# Patient Record
Sex: Female | Born: 1945 | Race: White | Hispanic: No | Smoking: Current every day smoker
Health system: Southern US, Community
[De-identification: ages and names within clinical notes are randomized; demographics above are authoritative.]

## PROBLEM LIST (undated history)

## (undated) DIAGNOSIS — N390 Urinary tract infection, site not specified: Secondary | ICD-10-CM

## (undated) DIAGNOSIS — T7840XA Allergy, unspecified, initial encounter: Secondary | ICD-10-CM

## (undated) DIAGNOSIS — E079 Disorder of thyroid, unspecified: Secondary | ICD-10-CM

## (undated) DIAGNOSIS — E785 Hyperlipidemia, unspecified: Secondary | ICD-10-CM

## (undated) HISTORY — DX: Urinary tract infection, site not specified: N39.0

## (undated) HISTORY — DX: Disorder of thyroid, unspecified: E07.9

## (undated) HISTORY — DX: Hyperlipidemia, unspecified: E78.5

## (undated) HISTORY — DX: Allergy, unspecified, initial encounter: T78.40XA

---

## 2011-07-18 ENCOUNTER — Ambulatory Visit (INDEPENDENT_AMBULATORY_CARE_PROVIDER_SITE_OTHER): Payer: Medicare Other | Admitting: Internal Medicine

## 2011-07-18 ENCOUNTER — Encounter: Payer: Self-pay | Admitting: Internal Medicine

## 2011-07-18 VITALS — BP 114/70 | HR 95 | Temp 97.6°F | Ht 66.0 in | Wt 151.0 lb

## 2011-07-18 DIAGNOSIS — S0003XA Contusion of scalp, initial encounter: Secondary | ICD-10-CM

## 2011-07-18 DIAGNOSIS — E785 Hyperlipidemia, unspecified: Secondary | ICD-10-CM | POA: Insufficient documentation

## 2011-07-18 DIAGNOSIS — E039 Hypothyroidism, unspecified: Secondary | ICD-10-CM | POA: Insufficient documentation

## 2011-07-18 DIAGNOSIS — Z0000 Encounter for general adult medical examination without abnormal findings: Secondary | ICD-10-CM

## 2011-07-18 DIAGNOSIS — S0083XA Contusion of other part of head, initial encounter: Secondary | ICD-10-CM

## 2011-07-18 MED ORDER — LEVOTHYROXINE SODIUM 75 MCG PO TABS: 75.0000 ug | ORAL_TABLET | Freq: Every day | ORAL | Status: DC

## 2011-07-18 NOTE — Progress Notes (Signed)
Subjective:    Patient ID: Anne Wiggins, female    DOB: 10-17-1945, 66 y.o.   MRN: [id number]  HPI  66 year old patient who is seen today to establish with our practice. She has recently relocated from [place]. 2 weeks ago she slipped on the ice sustaining a occipital scalp hematoma. This has slowly gotten smaller in size over the past 2 weeks. She has had no postconcussive symptoms. She has had only mild local scalp tenderness that is improving. There was no loss of consciousness.  Past medical history is fairly unremarkable. She has a history of hypothyroidism and has been on supplemental levothyroxine for some time. She has a history of dyslipidemia but never has been treated. Past surgical history is remarkable for a remote tonsillectomy. She also had  tendon repair surgery approximately one year ago. She has declined colonoscopies in the past   family history fairly noncontributory details of her father's health unknown. Mother died of metastatic gastric cancer   social history. Recently a relocated from [place] to be closer to their children. Presently using tobacco but attempting to taper  1. Risk factors, based on past  M,S,F history-  cardiovascular risk factors include a history of dyslipidemia and ongoing tobacco use  2.  Physical activities: Remains quite active no exercise limitations  3.  Depression/mood: No history depression or mood disorder  4.  Hearing: No deficits  5.  ADL's: Independent in all aspects of daily living. Lives alone  6.  Fall risk: Low  7.  Home safety: No problems identified  8.  Height weight, and visual acuity; height and weight stable no change in visual acuity  9.  Counseling: Colonoscopy encouraged vitamin D and calcium supplementation encouraged  10. Lab orders based on risk factors: Will check her Habits the time of a physical in 6 months  11. Referral : Not appropriate at this time 12. Care plan: Smoking cessation encouraged  13.  Cognitive assessment: Alert and oriented with normal affect. No cognitive dysfunction      Review of Systems  Constitutional: Negative for fever, appetite change, fatigue and unexpected weight change.  HENT: Negative for hearing loss, ear pain, nosebleeds, congestion, sore throat, mouth sores, trouble swallowing, neck stiffness, dental problem, voice change, sinus pressure and tinnitus.   Eyes: Negative for photophobia, pain, redness and visual disturbance.  Respiratory: Negative for cough, chest tightness and shortness of breath.   Cardiovascular: Negative for chest pain, palpitations and leg swelling.  Gastrointestinal: Negative for nausea, vomiting, abdominal pain, diarrhea, constipation, blood in stool, abdominal distention and rectal pain.  Genitourinary: Negative for dysuria, urgency, frequency, hematuria, flank pain, vaginal bleeding, vaginal discharge, difficulty urinating, genital sores, vaginal pain, menstrual problem and pelvic pain.  Musculoskeletal: Negative for back pain and arthralgias.  Skin: Negative for rash. Wound: resolving occipital scalp hematoma.  Neurological: Negative for dizziness, syncope, speech difficulty, weakness, light-headedness, numbness and headaches.  Hematological: Negative for adenopathy. Does not bruise/bleed easily.  Psychiatric/Behavioral: Negative for suicidal ideas, behavioral problems, self-injury, dysphoric mood and agitation. The patient is not nervous/anxious.        Objective:   Physical Exam  Constitutional: She is oriented to person, place, and time. She appears well-developed and well-nourished.  HENT:  Head: Normocephalic and atraumatic.  Right Ear: External ear normal.  Left Ear: External ear normal.  Mouth/Throat: Oropharynx is clear and moist.  Eyes: Conjunctivae and EOM are normal.  Neck: Normal range of motion. Neck supple. No JVD present. No thyromegaly present.  Cardiovascular: Normal  rate, regular rhythm, normal heart sounds  and intact distal pulses.   No murmur heard. Pulmonary/Chest: Effort normal and breath sounds normal. She has no wheezes. She has no rales.  Abdominal: Soft. Bowel sounds are normal. She exhibits no distension and no mass. There is no tenderness. There is no rebound and no guarding.  Genitourinary: Vagina normal.  Musculoskeletal: Normal range of motion. She exhibits no edema and no tenderness.  Neurological: She is alert and oriented to person, place, and time. She has normal reflexes. She displays normal reflexes. No cranial nerve deficit. She exhibits normal muscle tone. Coordination normal.  Skin: Skin is warm and dry. No rash noted.       2 cm occipital scalp hematoma  Psychiatric: She has a normal mood and affect. Her behavior is normal.          Assessment & Plan:  Preventive health examination Scalp hematoma status post fall 2 weeks ago. No postconcussive symptoms Hypothyroidism History dyslipidemia  We'll schedule for complete exam in 6 months. Medications refilled Cessation of tobacco use encouraged

## 2011-07-18 NOTE — Patient Instructions (Addendum)
Limit your sodium (Salt) intake    It is important that you exercise regularly, at least 20 minutes 3 to 4 times per week.  If you develop chest pain or shortness of breath seek  medical attention.  Take a calcium supplement, plus [phone] units of vitamin D  Return in 6 months for follow-up   Smoking tobacco is very bad for your health. You should stop smoking immediately.

## 2011-07-31 ENCOUNTER — Ambulatory Visit: Payer: Self-pay | Admitting: Internal Medicine

## 2012-08-07 ENCOUNTER — Other Ambulatory Visit: Payer: Self-pay | Admitting: Internal Medicine

## 2017-02-14 ENCOUNTER — Emergency Department (HOSPITAL_COMMUNITY)
Admission: EM | Admit: 2017-02-14 | Discharge: 2017-02-14 | Disposition: A | Discharge status: Home / Self Care | Payer: Medicare HMO | Attending: Emergency Medicine | Admitting: Emergency Medicine

## 2017-02-14 ENCOUNTER — Emergency Department (HOSPITAL_COMMUNITY): Payer: Medicare HMO

## 2017-02-14 ENCOUNTER — Encounter (HOSPITAL_COMMUNITY): Payer: Self-pay | Admitting: Emergency Medicine

## 2017-02-14 DIAGNOSIS — E039 Hypothyroidism, unspecified: Secondary | ICD-10-CM | POA: Insufficient documentation

## 2017-02-14 DIAGNOSIS — Z79899 Other long term (current) drug therapy: Secondary | ICD-10-CM | POA: Diagnosis not present

## 2017-02-14 DIAGNOSIS — R0789 Other chest pain: Secondary | ICD-10-CM | POA: Diagnosis not present

## 2017-02-14 DIAGNOSIS — F17210 Nicotine dependence, cigarettes, uncomplicated: Secondary | ICD-10-CM | POA: Insufficient documentation

## 2017-02-14 LAB — BASIC METABOLIC PANEL
Anion gap: 9 (ref 5–15)
BUN: 14 mg/dL (ref 6–20)
CO2: 28 mmol/L (ref 22–32)
Calcium: 9.7 mg/dL (ref 8.9–10.3)
Chloride: 104 mmol/L (ref 101–111)
Creatinine, Ser: 0.82 mg/dL (ref 0.44–1.00)
GFR calc Af Amer: >60 mL/min (ref >60)
GFR calc non Af Amer: >60 mL/min (ref >60)
Glucose, Bld: 108 mg/dL — ABNORMAL HIGH (ref 65–99)
Potassium: 4.3 mmol/L (ref 3.5–5.1)
Sodium: 141 mmol/L (ref 135–145)

## 2017-02-14 LAB — CBC
HCT: 42.6 % (ref 36.0–46.0)
Hemoglobin: 14.6 g/dL (ref 12.0–15.0)
MCH: 32.2 pg (ref 26.0–34.0)
MCHC: 34.3 g/dL (ref 30.0–36.0)
MCV: 93.8 fL (ref 78.0–100.0)
Platelets: 234 10*3/uL (ref 150–400)
RBC: 4.54 MIL/uL (ref 3.87–5.11)
RDW: 12.8 % (ref 11.5–15.5)
WBC: 10.1 10*3/uL (ref 4.0–10.5)

## 2017-02-14 LAB — I-STAT TROPONIN, ED: Troponin i, poc: 0.01 ng/mL (ref 0.00–0.08)

## 2017-02-14 NOTE — ED Provider Notes (Signed)
[facility]-EMERGENCY DEPT Provider Note   CSN: [id number] Arrival date & time: 02/14/17  1211     History   Chief Complaint Chief Complaint  Patient presents with  . Chest Pain    HPI Anne Wiggins is a 71 y.o. female.  HPI  71yF with CP. Onset a few days ago.L chest. Worse with movement and touch. Denies trauma/strain. No dyspnea, cough, diaphoresis, LE swelling. Does not radiate. Concerned that related to recently stopping statin.   Past Medical History:  Diagnosis Date  . Allergy   . Hyperlipidemia   . Thyroid disease   . UTI (urinary tract infection)     Patient Active Problem List   Diagnosis Date Noted  . Hypothyroidism 07/18/2011  . Dyslipidemia 07/18/2011    History reviewed. No pertinent surgical history.  OB History    No data available       Home Medications    Prior to Admission medications   Medication Sig Start Date End Date Taking? Authorizing Provider  levothyroxine (SYNTHROID, LEVOTHROID) 75 MCG tablet Take 1 tablet (75 mcg total) by mouth daily. 07/18/11   Gordy Savers, MD    Family History No family history on file.  Social History Social History  Substance Use Topics  . Smoking status: Current Every Day Smoker    Types: Cigarettes  . Smokeless tobacco: Never Used     Comment: pt states she is weaning off cigarettes; 6 weeks per carton   . Alcohol use Yes     Comment:  1 to 2 a month      Allergies   Patient has no known allergies.   Review of Systems Review of Systems  All systems reviewed and negative, other than as noted in HPI.  Physical Exam Updated Vital Signs BP (!) 167/81 (BP Location: Right Arm)   Pulse 97   Temp 98.1 F (36.7 C) (Oral)   Resp 20   Ht 5\' 6"  (1.676 m)   Wt 64.4 kg (142 lb)   SpO2 97%   BMI 22.92 kg/m   Physical Exam  Constitutional: She appears well-developed and well-nourished. No distress.  HENT:  Head: Normocephalic and atraumatic.  Eyes: Conjunctivae are normal. Right eye  exhibits no discharge. Left eye exhibits no discharge.  Neck: Neck supple.  Cardiovascular: Normal rate, regular rhythm and normal heart sounds.  Exam reveals no gallop and no friction rub.   No murmur heard. Pulmonary/Chest: Effort normal and breath sounds normal. No respiratory distress. She exhibits tenderness.  Abdominal: Soft. She exhibits no distension. There is no tenderness.  Musculoskeletal: She exhibits no edema or tenderness.  Neurological: She is alert.  Skin: Skin is warm and dry.  Psychiatric: She has a normal mood and affect. Her behavior is normal. Thought content normal.  Nursing note and vitals reviewed.    ED Treatments / Results  Labs (all labs ordered are listed, but only abnormal results are displayed) Labs Reviewed  CBC  BASIC METABOLIC PANEL  I-STAT TROPONIN, ED    EKG  EKG Interpretation  Date/Time:  Wednesday February 14 2017 12:16:59 EDT Ventricular Rate:  98 PR Interval:    QRS Duration: 94 QT Interval:  347 QTC Calculation: 443 R Axis:   60 Text Interpretation:  Sinus rhythm Borderline repolarization abnormality Confirmed by Raeford Razor [phone]) on 02/14/2017 12:41:33 PM       Radiology Dg Chest 2 View  Result Date: 02/14/2017 CLINICAL DATA:  Left-sided chest pain with raising of the left arm. EXAM: CHEST  2 VIEW COMPARISON:  None. FINDINGS: Cardiomediastinal silhouette is normal. Mediastinal contours appear intact. Calcific atherosclerotic disease of the aorta. There is no evidence of focal airspace consolidation, pleural effusion or pneumothorax. Mild biapical pleural thickening. Mild coarsening of the interstitial markings. Osseous structures are without acute abnormality. Soft tissues are grossly normal. IMPRESSION: Calcific atherosclerotic disease of the aorta. Mild biapical pleural thickening and coarsening of the interstitial markings, nonspecific. Electronically Signed   By: Ted Mcalpineobrinka  Dimitrova M.D.   On: 02/14/2017 12:41     Procedures Procedures (including critical care time)  Medications Ordered in ED Medications - No data to display   Initial Impression / Assessment and Plan / ED Course  I have reviewed the triage vital signs and the nursing notes.  Pertinent labs & imaging results that were available during my care of the patient were reviewed by me and considered in my medical decision making (see chart for details).     71 year old female with chest pain. This is extremely reproducible with palpation and certain movements. Most consistent with chest wall pain. Very atypical for ACS. Doubt PE, dissection or other emergent pathology. Chest x-ray is without acute abnormality. EKG is not acutely changed. Patient is concerned that symptoms may be discontinuing statin about a week ago. Likely completely unrelated. Symptomatic treatment. Activity as tolerated. Return precautions discussed.  Final Clinical Impressions(s) / ED Diagnoses   Final diagnoses:  Chest wall pain    New Prescriptions New Prescriptions   No medications on file     Raeford RazorKohut, Keimora Swartout, MD 03/09/17 [phone]

## 2017-02-14 NOTE — ED Notes (Addendum)
Pt had drawn for labs: Blue Red  Lavender Green Lt green x2 Dark green x2 Doylene Canning

## 2017-02-14 NOTE — ED Notes (Signed)
Pt ambulatory and independent at discharge.  Verbalized understanding of discharge instructions 

## 2017-02-14 NOTE — ED Notes (Signed)
ED Provider at bedside. 

## 2017-02-14 NOTE — ED Notes (Signed)
Bed: WA02 Expected date:  Expected time:  Means of arrival:  Comments: 

## 2017-02-14 NOTE — ED Triage Notes (Signed)
Pt complaint left chest pain when left arm is raised ONLY, left chest tender with touch. Pt concerned it is related to discontinued statin drug related to memory loss a week ago.

## 2020-11-28 ENCOUNTER — Other Ambulatory Visit: Payer: Self-pay

## 2020-11-28 ENCOUNTER — Emergency Department (HOSPITAL_COMMUNITY)
Admission: EM | Admit: 2020-11-28 | Discharge: 2020-11-28 | Disposition: A | Discharge status: Home / Self Care | Payer: Medicare HMO | Attending: Emergency Medicine | Admitting: Emergency Medicine

## 2020-11-28 ENCOUNTER — Emergency Department (HOSPITAL_COMMUNITY): Payer: Medicare HMO

## 2020-11-28 DIAGNOSIS — E039 Hypothyroidism, unspecified: Secondary | ICD-10-CM | POA: Insufficient documentation

## 2020-11-28 DIAGNOSIS — Z23 Encounter for immunization: Secondary | ICD-10-CM | POA: Insufficient documentation

## 2020-11-28 DIAGNOSIS — S0101XA Laceration without foreign body of scalp, initial encounter: Secondary | ICD-10-CM

## 2020-11-28 DIAGNOSIS — W07XXXA Fall from chair, initial encounter: Secondary | ICD-10-CM | POA: Diagnosis not present

## 2020-11-28 DIAGNOSIS — S0990XA Unspecified injury of head, initial encounter: Secondary | ICD-10-CM | POA: Diagnosis present

## 2020-11-28 DIAGNOSIS — Z79899 Other long term (current) drug therapy: Secondary | ICD-10-CM | POA: Insufficient documentation

## 2020-11-28 DIAGNOSIS — W19XXXA Unspecified fall, initial encounter: Secondary | ICD-10-CM

## 2020-11-28 DIAGNOSIS — G309 Alzheimer's disease, unspecified: Secondary | ICD-10-CM | POA: Insufficient documentation

## 2020-11-28 DIAGNOSIS — F17210 Nicotine dependence, cigarettes, uncomplicated: Secondary | ICD-10-CM | POA: Diagnosis not present

## 2020-11-28 MED ORDER — BACITRACIN ZINC 500 UNIT/GM EX OINT
TOPICAL_OINTMENT | CUTANEOUS | Status: AC
  Administered 2020-11-28: 1
  Filled 2020-11-28: qty 0.9

## 2020-11-28 MED ORDER — TETANUS-DIPHTH-ACELL PERTUSSIS 5-2.5-18.5 LF-MCG/0.5 IM SUSY
0.5000 mL | PREFILLED_SYRINGE | Freq: Once | INTRAMUSCULAR | Status: AC
  Administered 2020-11-28: 0.5 mL via INTRAMUSCULAR
  Filled 2020-11-28: qty 0.5

## 2020-11-28 MED ORDER — LIDOCAINE-EPINEPHRINE-TETRACAINE (LET) TOPICAL GEL
3.0000 mL | Freq: Once | TOPICAL | Status: AC
  Administered 2020-11-28: 3 mL via TOPICAL
  Filled 2020-11-28: qty 3

## 2020-11-28 NOTE — ED Notes (Signed)
Patient transported to CT 

## 2020-11-28 NOTE — ED Provider Notes (Signed)
[facility] DEPT Provider Note   CSN: [id number] Arrival date & time: 11/28/20  1642     History Chief Complaint  Patient presents with   Marletta Lor    Anne Wiggins is a 75 y.o. female.  The history is provided by the patient and medical records. The history is limited by the condition of the patient.  Fall This is a new problem. The current episode started 3 to 5 hours ago. The problem occurs rarely. The problem has not changed since onset.Associated symptoms include headaches. Pertinent negatives include no chest pain, no abdominal pain and no shortness of breath. Nothing aggravates the symptoms. Nothing relieves the symptoms. She has tried nothing for the symptoms. The treatment provided no relief.      Past Medical History:  Diagnosis Date   Allergy    Hyperlipidemia    Thyroid disease    UTI (urinary tract infection)     Patient Active Problem List   Diagnosis Date Noted   Hypothyroidism 07/18/2011   Dyslipidemia 07/18/2011    No past surgical history on file.   OB History   No obstetric history on file.     No family history on file.  Social History   Tobacco Use   Smoking status: Every Day    Pack years: 0.00    Types: Cigarettes   Smokeless tobacco: Never   Tobacco comments:    pt states she is weaning off cigarettes; 6 weeks per carton   Substance Use Topics   Alcohol use: Yes    Comment:  1 to 2 a month     Home Medications Prior to Admission medications   Medication Sig Start Date End Date Taking? Authorizing Provider  levothyroxine (SYNTHROID, LEVOTHROID) 75 MCG tablet Take 1 tablet (75 mcg total) by mouth daily. 07/18/11   Gordy Savers, MD    Allergies    Patient has no known allergies.  Review of Systems   Review of Systems  Unable to perform ROS: Dementia  Constitutional:  Negative for chills and fever.  HENT:  Negative for congestion.   Eyes:  Negative for visual disturbance.  Respiratory:   Negative for cough, chest tightness, shortness of breath and wheezing.   Cardiovascular:  Negative for chest pain and palpitations.  Gastrointestinal:  Negative for abdominal pain, constipation, diarrhea, nausea and vomiting.  Genitourinary:  Negative for flank pain.  Musculoskeletal:  Negative for back pain, neck pain and neck stiffness.  Skin:  Negative for rash and wound.  Neurological:  Positive for headaches. Negative for dizziness, weakness and light-headedness.  Psychiatric/Behavioral:  Negative for agitation and confusion (at baseline).   All other systems reviewed and are negative.  Physical Exam Updated Vital Signs BP (!) 149/65 (BP Location: Left Arm)   Pulse 88   Temp 98.5 F (36.9 C) (Oral)   Resp 16   SpO2 96%   Physical Exam Vitals and nursing note reviewed.  Constitutional:      General: She is not in acute distress.    Appearance: She is well-developed. She is not ill-appearing, toxic-appearing or diaphoretic.  HENT:     Head: Normocephalic and atraumatic.      Comments: C-collar in place on arrival.    Mouth/Throat:     Mouth: Mucous membranes are moist.  Eyes:     Extraocular Movements: Extraocular movements intact.     Conjunctiva/sclera: Conjunctivae normal.  Neck:     Comments: In c collar Cardiovascular:     Rate and  Rhythm: Normal rate and regular rhythm.     Heart sounds: No murmur heard. Pulmonary:     Effort: Pulmonary effort is normal. No respiratory distress.     Breath sounds: Normal breath sounds. No wheezing, rhonchi or rales.  Abdominal:     Palpations: Abdomen is soft.     Tenderness: There is no abdominal tenderness. There is no guarding or rebound.  Musculoskeletal:        General: Tenderness (laceration) present.     Cervical back: Neck supple. No tenderness.  Skin:    General: Skin is warm and dry.     Capillary Refill: Capillary refill takes less than 2 seconds.     Findings: No erythema.  Neurological:     Mental Status: She  is alert. Mental status is at baseline. She is disoriented.     Sensory: No sensory deficit.     Motor: No weakness.     ED Results / Procedures / Treatments   Labs (all labs ordered are listed, but only abnormal results are displayed) Labs Reviewed - No data to display  EKG None  Radiology CT Head Wo Contrast  Result Date: 11/28/2020 CLINICAL DATA:  Unwitnessed fall peer EXAM: CT HEAD WITHOUT CONTRAST CT CERVICAL SPINE WITHOUT CONTRAST TECHNIQUE: Multidetector CT imaging of the head and cervical spine was performed following the standard protocol without intravenous contrast. Multiplanar CT image reconstructions of the cervical spine were also generated. COMPARISON:  None. FINDINGS: CT HEAD FINDINGS Brain: Cerebral ventricle sizes are concordant with the degree of cerebral volume loss. Patchy and confluent areas of decreased attenuation are noted throughout the deep and periventricular white matter of the cerebral hemispheres bilaterally, compatible with chronic microvascular ischemic disease. No evidence of large-territorial acute infarction. No parenchymal hemorrhage. No mass lesion. No extra-axial collection. Cavum septum variant. No mass effect or midline shift. No hydrocephalus. Basilar cisterns are patent. Vascular: No hyperdense vessel. Atherosclerotic calcifications are present within the cavernous internal carotid arteries. Skull: No acute fracture or focal lesion. Sinuses/Orbits: Paranasal sinuses and mastoid air cells are clear. The orbits are unremarkable. Other: None. CT CERVICAL SPINE FINDINGS Alignment: Normal. Skull base and vertebrae: Multilevel moderate degenerative changes of the spine. No acute fracture. No aggressive appearing focal osseous lesion or focal pathologic process. Soft tissues and spinal canal: No prevertebral fluid or swelling. No visible canal hematoma. Upper chest: Bilateral upper lobe emphysematous changes. Biapical pleural/pulmonary scarring. Other:  Atherosclerotic plaque. IMPRESSION: No acute intracranial abnormality. No acute displaced fracture or traumatic listhesis of the cervical spine. Aortic Atherosclerosis (ICD10-I70.0) and Emphysema (ICD10-J43.9). Electronically Signed   By: Tish FredericksonMorgane  Naveau M.D.   On: 11/28/2020 20:50   CT Cervical Spine Wo Contrast  Result Date: 11/28/2020 CLINICAL DATA:  Unwitnessed fall peer EXAM: CT HEAD WITHOUT CONTRAST CT CERVICAL SPINE WITHOUT CONTRAST TECHNIQUE: Multidetector CT imaging of the head and cervical spine was performed following the standard protocol without intravenous contrast. Multiplanar CT image reconstructions of the cervical spine were also generated. COMPARISON:  None. FINDINGS: CT HEAD FINDINGS Brain: Cerebral ventricle sizes are concordant with the degree of cerebral volume loss. Patchy and confluent areas of decreased attenuation are noted throughout the deep and periventricular white matter of the cerebral hemispheres bilaterally, compatible with chronic microvascular ischemic disease. No evidence of large-territorial acute infarction. No parenchymal hemorrhage. No mass lesion. No extra-axial collection. Cavum septum variant. No mass effect or midline shift. No hydrocephalus. Basilar cisterns are patent. Vascular: No hyperdense vessel. Atherosclerotic calcifications are present within the cavernous internal  carotid arteries. Skull: No acute fracture or focal lesion. Sinuses/Orbits: Paranasal sinuses and mastoid air cells are clear. The orbits are unremarkable. Other: None. CT CERVICAL SPINE FINDINGS Alignment: Normal. Skull base and vertebrae: Multilevel moderate degenerative changes of the spine. No acute fracture. No aggressive appearing focal osseous lesion or focal pathologic process. Soft tissues and spinal canal: No prevertebral fluid or swelling. No visible canal hematoma. Upper chest: Bilateral upper lobe emphysematous changes. Biapical pleural/pulmonary scarring. Other: Atherosclerotic  plaque. IMPRESSION: No acute intracranial abnormality. No acute displaced fracture or traumatic listhesis of the cervical spine. Aortic Atherosclerosis (ICD10-I70.0) and Emphysema (ICD10-J43.9). Electronically Signed   By: Tish Frederickson M.D.   On: 11/28/2020 20:50    Procedures .Marland KitchenLaceration Repair  Date/Time: 11/28/2020 10:27 PM Performed by: Heide Scales, MD Authorized by: Heide Scales, MD   Consent:    Consent obtained:  Verbal   Consent given by:  Patient (daugter)   Risks discussed:  Infection, pain and poor wound healing   Alternatives discussed:  No treatment Universal protocol:    Patient identity confirmed:  Verbally with patient and arm band Anesthesia:    Anesthesia method:  Topical application Laceration details:    Location:  Scalp   Scalp location:  L parietal   Length (cm):  1   Depth (mm):  2 Treatment:    Area cleansed with:  Saline   Amount of cleaning:  Standard   Irrigation solution:  Sterile saline   Visualized foreign bodies/material removed: no     Debridement:  None Skin repair:    Repair method:  Staples   Number of staples:  2 Approximation:    Approximation:  Close Repair type:    Repair type:  Simple Post-procedure details:    Dressing:  Antibiotic ointment   Procedure completion:  Tolerated   Medications Ordered in ED Medications  lidocaine-EPINEPHrine-tetracaine (LET) topical gel (3 mLs Topical Given 11/28/20 1813)  Tdap (BOOSTRIX) injection 0.5 mL (0.5 mLs Intramuscular Given 11/28/20 1805)  bacitracin 500 UNIT/GM ointment (1 application  Given 11/28/20 2222)    ED Course  I have reviewed the triage vital signs and the nursing notes.  Pertinent labs & imaging results that were available during my care of the patient were reviewed by me and considered in my medical decision making (see chart for details).    MDM Rules/Calculators/A&P                          Kinzi Frediani is a 75 y.o. female with a past medical  history sent for Alzheimer's dementia, hypothyroidism, and hyperlipidemia who reportedly had unwitnessed fall at her facility today out of a chair.  Patient reportedly was sitting in a chair and then fell out hitting her left occiput on the ground.  She denies loss of consciousness and reports mild headache.  She has a bump on her left temple which is old.  She has a bump/hematoma and an overlying laceration on the left occiput.  She is reporting some mild headache but denies any neck pain.  She is in a c-collar which is bothering her.  She denies any preceding symptoms such as fevers, chills, chest pain or shortness breath, nausea, vomiting, urinary changes or GI symptoms.  She reports she feels at her baseline.  She is disoriented but per EMS is at her baseline now.    On exam, patient has a large knee immobilizer on her left leg that has fiberglass keeping  it closed.  She is able to move both of her feet and has normal sensation and strength throughout.  She denies any pain from her neck down.  Lungs clear and chest nontender.  Abdomen nontender.  Moving all extremities.  She is disoriented but reportedly is at her baseline now.  Pupils are symmetric and reactive, extraocular movements.  Clear speech.  Neck is in collar but was nontender.  Small laceration in left occiput.  No crepitance.  Will get CT head and C-spine and repair laceration.  Given lack of preceding symptoms and no other deviation from baseline reported, anticipate discharge back to facility after wound repair.    Tetanus does not appear up-to-date and patient was unsure of last tetanus shot.  Will update Tdap.  CT scan showed no evidence of intracranial bleeding, fracture, or dislocation of the neck or skull.  Laceration was repaired after discussion with the family.  2 staples were placed without difficulty.  Bacitracin was applied and patient will be discharged back to her facility as she is at her mental status baseline.  Patient and  family agreed with plan of care and patient discharged in good condition.  Final Clinical Impression(s) / ED Diagnoses Final diagnoses:  Fall, initial encounter  Laceration of scalp, initial encounter     Clinical Impression: 1. Fall, initial encounter   2. Laceration of scalp, initial encounter     Disposition: Discharge  Condition: Good  I have discussed the results, Dx and Tx plan with the pt(& family if present). Anne Wiggins expressed understanding and agree(s) with the plan. Discharge instructions discussed at great length. Strict return precautions discussed and pt &/or family have verbalized understanding of the instructions. No further questions at time of discharge.    New Prescriptions   No medications on file    Follow Up: Gordy Savers, MD [address] [place] [address] [phone]     [facility] DEPT [address] [id number] mc [place] [address] [phone]       Corrinna Karapetyan, Canary Brim, MD 11/28/20 2229

## 2020-11-28 NOTE — Discharge Instructions (Addendum)
Your history, exam, work-up today are consistent with a small laceration on your scalp due to the fall.  There is no evidence of acute intracranial injury and your CT of the neck was also reassuring.  We updated your tetanus and repaired the laceration with staples.  Please follow-up with your primary doctor in 7 to 10 days for staple removal.  Please watch for signs and symptoms of infection.  If any symptoms change or worsen acutely, please turn to the nearest emergency department.  Please be careful not to fall.

## 2020-11-28 NOTE — ED Notes (Addendum)
[facility] called for transport.  

## 2020-11-28 NOTE — ED Notes (Signed)
Patient back from CT.

## 2020-11-28 NOTE — ED Triage Notes (Addendum)
[facility] transferred pt from [facility] on [address] and reports the following.  Around 45 minutes ago, pt had an unwitnessed fall from a chair to the floor. Laceration on back left side of head. No loss of consciousness. Old bruising on left check. AOx1 at baseline. Pt at baseline.

## 2020-11-28 NOTE — ED Notes (Signed)
Cleaned laceration on back left side of head with sterile water and dermal wound cleanser. Applied LET. Daughter at bedside.

## 2020-12-02 ENCOUNTER — Encounter (HOSPITAL_COMMUNITY): Payer: Self-pay

## 2020-12-02 ENCOUNTER — Emergency Department (HOSPITAL_COMMUNITY): Payer: Medicare HMO

## 2020-12-02 ENCOUNTER — Emergency Department (HOSPITAL_COMMUNITY)
Admission: EM | Admit: 2020-12-02 | Discharge: 2020-12-02 | Disposition: A | Discharge status: Skilled Nursing Facility | Payer: Medicare HMO | Attending: Emergency Medicine | Admitting: Emergency Medicine

## 2020-12-02 DIAGNOSIS — M25552 Pain in left hip: Secondary | ICD-10-CM | POA: Insufficient documentation

## 2020-12-02 DIAGNOSIS — S42472A Displaced transcondylar fracture of left humerus, initial encounter for closed fracture: Secondary | ICD-10-CM | POA: Diagnosis not present

## 2020-12-02 DIAGNOSIS — Z79899 Other long term (current) drug therapy: Secondary | ICD-10-CM | POA: Insufficient documentation

## 2020-12-02 DIAGNOSIS — E039 Hypothyroidism, unspecified: Secondary | ICD-10-CM | POA: Diagnosis not present

## 2020-12-02 DIAGNOSIS — F0390 Unspecified dementia without behavioral disturbance: Secondary | ICD-10-CM | POA: Diagnosis not present

## 2020-12-02 DIAGNOSIS — M25522 Pain in left elbow: Secondary | ICD-10-CM | POA: Diagnosis not present

## 2020-12-02 DIAGNOSIS — F17210 Nicotine dependence, cigarettes, uncomplicated: Secondary | ICD-10-CM | POA: Diagnosis not present

## 2020-12-02 DIAGNOSIS — Y92129 Unspecified place in nursing home as the place of occurrence of the external cause: Secondary | ICD-10-CM | POA: Diagnosis not present

## 2020-12-02 DIAGNOSIS — S0093XA Contusion of unspecified part of head, initial encounter: Secondary | ICD-10-CM | POA: Diagnosis not present

## 2020-12-02 DIAGNOSIS — S42452A Displaced fracture of lateral condyle of left humerus, initial encounter for closed fracture: Secondary | ICD-10-CM

## 2020-12-02 DIAGNOSIS — S4992XA Unspecified injury of left shoulder and upper arm, initial encounter: Secondary | ICD-10-CM | POA: Diagnosis present

## 2020-12-02 DIAGNOSIS — W1809XA Striking against other object with subsequent fall, initial encounter: Secondary | ICD-10-CM | POA: Diagnosis not present

## 2020-12-02 MED ORDER — OXYCODONE-ACETAMINOPHEN 5-325 MG PO TABS
1.0000 | ORAL_TABLET | Freq: Once | ORAL | Status: AC
  Administered 2020-12-02: 1 via ORAL
  Filled 2020-12-02: qty 1

## 2020-12-02 NOTE — ED Notes (Signed)
Called [facility] for transport.  

## 2020-12-02 NOTE — ED Triage Notes (Addendum)
Patient arrived via [facility] from [facility] [address] Room #115.  Called out for a witness fall. Patient is at this facility for previous fall and rehab for fracture femur. Patient was not suppose to be ambulating without assistance or a walker. Staff saw her fall today and hit her head on the wall/corner without using her walker or asking for assistance. Patient also went down on left elbow.   C/O hematoma on left side near hair line and generalized left arm pain.    No blood thinner or LOC.  Denies back or neck pain.   Hx: Dementia and at baseline per staff.  20g r-fa  100 mcg fentanyl given by ems  150/70 Hr-90 regular  98% RA RR-19  Per staff patient ate breakfast and had all of her morning medications.

## 2020-12-02 NOTE — ED Notes (Signed)
This RN wrapped brace on pt's left arm with medical tape. Per pt's daughter, Corrie Dandy, pt will take brace off d/t PMH of Alzheimer's. Tape applied to prevent this from occurring

## 2020-12-02 NOTE — ED Notes (Signed)
Called ortho tech to apply splint 

## 2020-12-02 NOTE — ED Notes (Signed)
Nelida Gores, daughter to update her on pt's status

## 2020-12-02 NOTE — ED Provider Notes (Signed)
[facility] [place] [facility]-EMERGENCY DEPT Provider Note   CSN: [id number] Arrival date & time: 12/02/20  1352     History Chief Complaint  Patient presents with   Fall    Left Arm pain     Anne Wiggins is a 75 y.o. female.  75 year old female who presents at with his father nursing home.  Has history of dementia.  Is currently receiving rehab for left femur fracture.  Did not have any loss of consciousness.  Does not take any blood thinners.  Complains of pain to her left elbow.  Per EMS, staff states that she is at her baseline.  Did sustain hematoma to the left side of her head.  Has had no emesis.      Past Medical History:  Diagnosis Date   Allergy    Hyperlipidemia    Thyroid disease    UTI (urinary tract infection)     Patient Active Problem List   Diagnosis Date Noted   Hypothyroidism 07/18/2011   Dyslipidemia 07/18/2011    History reviewed. No pertinent surgical history.   OB History   No obstetric history on file.     No family history on file.  Social History   Tobacco Use   Smoking status: Every Day    Pack years: 0.00    Types: Cigarettes   Smokeless tobacco: Never   Tobacco comments:    pt states she is weaning off cigarettes; 6 weeks per carton   Substance Use Topics   Alcohol use: Yes    Comment:  1 to 2 a month     Home Medications Prior to Admission medications   Medication Sig Start Date End Date Taking? Authorizing Provider  levothyroxine (SYNTHROID, LEVOTHROID) 75 MCG tablet Take 1 tablet (75 mcg total) by mouth daily. 07/18/11   Gordy Savers, MD    Allergies    Patient has no known allergies.  Review of Systems   Review of Systems  Unable to perform ROS: Dementia   Physical Exam Updated Vital Signs BP (!) 144/62 (BP Location: Right Arm) Comment: ems noted pt has lt arm pain  Pulse 88   Temp 98.1 F (36.7 C) (Oral)   Resp 16   SpO2 92%   Physical Exam Vitals and nursing note reviewed.  Constitutional:       General: She is not in acute distress.    Appearance: Normal appearance. She is well-developed. She is not toxic-appearing.  HENT:     Head: Normocephalic and atraumatic.  Eyes:     General: Lids are normal.     Conjunctiva/sclera: Conjunctivae normal.     Pupils: Pupils are equal, round, and reactive to light.  Neck:     Thyroid: No thyroid mass.     Trachea: No tracheal deviation.  Cardiovascular:     Rate and Rhythm: Normal rate and regular rhythm.     Heart sounds: Normal heart sounds. No murmur heard.   No gallop.  Pulmonary:     Effort: Pulmonary effort is normal. No respiratory distress.     Breath sounds: Normal breath sounds. No stridor. No decreased breath sounds, wheezing, rhonchi or rales.  Abdominal:     General: There is no distension.     Palpations: Abdomen is soft.     Tenderness: There is no abdominal tenderness. There is no rebound.  Musculoskeletal:        General: Normal range of motion.     Left elbow: No deformity, effusion or lacerations. Tenderness  present.     Cervical back: Normal range of motion and neck supple.     Comments: Cast is a knee immobilizer noted to left lower extremity.  Neurovascular status intact at left foot  Skin:    General: Skin is warm and dry.     Findings: No abrasion or rash.  Neurological:     Mental Status: She is alert. Mental status is at baseline. She is disoriented.     GCS: GCS eye subscore is 4. GCS verbal subscore is 5. GCS motor subscore is 6.     Cranial Nerves: Cranial nerves are intact. No cranial nerve deficit.     Sensory: No sensory deficit.     Motor: Motor function is intact.  Psychiatric:        Attention and Perception: Attention normal.        Mood and Affect: Affect is blunt.        Behavior: Behavior is cooperative.    ED Results / Procedures / Treatments   Labs (all labs ordered are listed, but only abnormal results are displayed) Labs Reviewed - No data to  display  EKG None  Radiology No results found.  Procedures Procedures   Medications Ordered in ED Medications - No data to display  ED Course  I have reviewed the triage vital signs and the nursing notes.  Pertinent labs & imaging results that were available during my care of the patient were reviewed by me and considered in my medical decision making (see chart for details).    MDM Rules/Calculators/A&P                          Discussed with Dr. Edward Jolly orthopedics recommends patient be placed in a posterior splint and he will see the patient on Monday in follow-up.  Head CT without acute findings.  No new findings on patient's hip x-ray Final Clinical Impression(s) / ED Diagnoses Final diagnoses:  None    Rx / DC Orders ED Discharge Orders     None        Lorre Nick, MD 12/02/20 1657

## 2020-12-02 NOTE — ED Notes (Signed)
This RN attempted to call report to [facility] center with no answer

## 2021-02-21 ENCOUNTER — Other Ambulatory Visit: Payer: Self-pay

## 2021-02-21 ENCOUNTER — Emergency Department (HOSPITAL_COMMUNITY): Payer: Medicare HMO

## 2021-02-21 ENCOUNTER — Encounter (HOSPITAL_COMMUNITY): Payer: Self-pay | Admitting: Emergency Medicine

## 2021-02-21 ENCOUNTER — Emergency Department (HOSPITAL_COMMUNITY)
Admission: EM | Admit: 2021-02-21 | Discharge: 2021-02-22 | Disposition: A | Discharge status: Home / Self Care | Payer: Medicare HMO | Attending: Emergency Medicine | Admitting: Emergency Medicine

## 2021-02-21 DIAGNOSIS — F0390 Unspecified dementia without behavioral disturbance: Secondary | ICD-10-CM | POA: Diagnosis not present

## 2021-02-21 DIAGNOSIS — E039 Hypothyroidism, unspecified: Secondary | ICD-10-CM | POA: Insufficient documentation

## 2021-02-21 DIAGNOSIS — Z79899 Other long term (current) drug therapy: Secondary | ICD-10-CM | POA: Diagnosis not present

## 2021-02-21 DIAGNOSIS — U071 COVID-19: Secondary | ICD-10-CM | POA: Diagnosis not present

## 2021-02-21 DIAGNOSIS — F17210 Nicotine dependence, cigarettes, uncomplicated: Secondary | ICD-10-CM | POA: Diagnosis not present

## 2021-02-21 DIAGNOSIS — R0602 Shortness of breath: Secondary | ICD-10-CM | POA: Diagnosis present

## 2021-02-21 DIAGNOSIS — R4182 Altered mental status, unspecified: Secondary | ICD-10-CM | POA: Insufficient documentation

## 2021-02-21 DIAGNOSIS — N3000 Acute cystitis without hematuria: Secondary | ICD-10-CM

## 2021-02-21 DIAGNOSIS — R262 Difficulty in walking, not elsewhere classified: Secondary | ICD-10-CM

## 2021-02-21 LAB — BRAIN NATRIURETIC PEPTIDE: B Natriuretic Peptide: 63.1 pg/mL (ref 0.0–100.0)

## 2021-02-21 LAB — CBC
HCT: 43.1 % (ref 36.0–46.0)
Hemoglobin: 13.5 g/dL (ref 12.0–15.0)
MCH: 29.3 pg (ref 26.0–34.0)
MCHC: 31.3 g/dL (ref 30.0–36.0)
MCV: 93.7 fL (ref 80.0–100.0)
Platelets: 224 10*3/uL (ref 150–400)
RBC: 4.6 MIL/uL (ref 3.87–5.11)
RDW: 14.7 % (ref 11.5–15.5)
WBC: 9.7 10*3/uL (ref 4.0–10.5)
nRBC: 0 % (ref 0.0–0.2)

## 2021-02-21 LAB — COMPREHENSIVE METABOLIC PANEL
ALT: 16 U/L (ref 0–44)
AST: 36 U/L (ref 15–41)
Albumin: 3.9 g/dL (ref 3.5–5.0)
Alkaline Phosphatase: 85 U/L (ref 38–126)
Anion gap: 9 (ref 5–15)
BUN: 22 mg/dL (ref 8–23)
CO2: 28 mmol/L (ref 22–32)
Calcium: 9 mg/dL (ref 8.9–10.3)
Chloride: 105 mmol/L (ref 98–111)
Creatinine, Ser: 0.98 mg/dL (ref 0.44–1.00)
GFR, Estimated: 60 mL/min — ABNORMAL LOW (ref >60)
Glucose, Bld: 123 mg/dL — ABNORMAL HIGH (ref 70–99)
Potassium: 4.4 mmol/L (ref 3.5–5.1)
Sodium: 142 mmol/L (ref 135–145)
Total Bilirubin: 0.9 mg/dL (ref 0.3–1.2)
Total Protein: 7.2 g/dL (ref 6.5–8.1)

## 2021-02-21 LAB — TROPONIN I (HIGH SENSITIVITY)
Troponin I (High Sensitivity): 11 ng/L (ref <18)
Troponin I (High Sensitivity): 12 ng/L (ref <18)

## 2021-02-21 LAB — CBG MONITORING, ED: Glucose-Capillary: 116 mg/dL — ABNORMAL HIGH (ref 70–99)

## 2021-02-21 MED ORDER — SODIUM CHLORIDE 0.9 % IV BOLUS
500.0000 mL | Freq: Once | INTRAVENOUS | Status: AC
  Administered 2021-02-21: 500 mL via INTRAVENOUS

## 2021-02-21 NOTE — ED Triage Notes (Signed)
Pt BIB EMS from [place] [facility] [place]. Facility weakness, and decreased mobility that they first noticed at 1800 tonight. Pt has hx of dementia. Facility reports pt A&Ox1, pt is at baseline.  132/80 84 HR 98% RA CBG 117

## 2021-02-21 NOTE — ED Notes (Signed)
Patients daughter and POA would like a call back at : [phone] [name]

## 2021-02-21 NOTE — ED Provider Notes (Signed)
75 year old female received at signout pending urinalysis and CT head. Per PA Couture's HPI:   "75 year old female with a history of allergies, hyperlipidemia, thyroid disease, UTI, dementia, who presents to the emergency department today for evaluation of altered mental status and generalized weakness.     There is a level 5 caveat due to patient's history of dementia.  Per EMS report, patient is ANO x1 at baseline.  Facility felt like around 6:00 she was more weak than she normally is.   7:55 PM attempted to contact [facility], and was told the the memory care unit staff is not available. I left my number for them to call me back.    8:32 PM Discussed case with Tiana at [facility]. She states that she noticed that the patient was abnormal when she came on her shift around 330.  She noticed that the patient was weaker than normal.  She is normally walking independently but today was unable to walk by herself or transfer without assistance.  She is normally more more conversant as well.  She does not know when the patient's last known well is that she was not on shift prior to this.  She has not noticed any recent illnesses other than rhinorrhea today.  The patient has had no fevers, vomiting cough or other symptoms that she is aware of."  Physical Exam  BP (!) 140/53   Pulse 80   Temp 98.9 F (37.2 C) (Rectal)   Resp (!) 21   Ht 5\' 6"  (1.676 m)   Wt 64.4 kg   SpO2 94%   BMI 22.92 kg/m   Physical Exam Vitals and nursing note reviewed.  Constitutional:      Appearance: She is well-developed.  HENT:     Head: Normocephalic and atraumatic.  Cardiovascular:     Rate and Rhythm: Normal rate.  Pulmonary:     Effort: Pulmonary effort is normal.  Abdominal:     Tenderness: There is no abdominal tenderness.  Musculoskeletal:        General: Normal range of motion.     Cervical back: Normal range of motion.  Neurological:     Mental Status: She is alert and oriented to person, place, and time.     ED Course/Procedures     Procedures  MDM   75 year old female received in signout pending urinalysis and head CT.  In brief, patient was brought in from [facility] for generalized weakness.  She typically ambulates independently and was unable to walk today.  Please see previous providers note for further work-up and medical decision making.  UA is concerning for infection.  Staff noticed gross purulence on the sample.  No leukocytosis, but will treat for complicated UTI with Rocephin and discharged with cefdinir.  Urine culture has been sent.  CT head is unremarkable.  Patient also notably tested positive for COVID-19.  No history of recent infections so assume that this is acute may be contributing to her generalized weakness.  Patient was previously given IV fluids.  She was observed for 8 hours in the emergency department and was able to ambulate prior to discharge.  She can be discharged back to her facility with cefdinir.  She is hemodynamically stable in no acute distress.  Safe for discharge home with outpatient follow-up as discussed.       [place], PA-C 02/22/21 0414    04/24/21, MD 02/22/21 [phone]

## 2021-02-21 NOTE — ED Provider Notes (Signed)
[facility]-EMERGENCY DEPT Provider Note   CSN: [id number] Arrival date & time: 02/21/21  1923     History Chief Complaint  Patient presents with   Weakness    Anne Wiggins is a 75 y.o. female.  HPI  75 year old female with a history of allergies, hyperlipidemia, thyroid disease, UTI, dementia, who presents to the emergency department today for evaluation of altered mental status and generalized weakness.    There is a level 5 caveat due to patient's history of dementia.  Per EMS report, patient is ANO x1 at baseline.  Facility felt like around 6:00 she was more weak than she normally is.  7:55 PM attempted to contact [facility], and was told the the memory care unit staff is not available. I left my number for them to call me back.   8:32 PM Discussed case with Tiana at [facility]. She states that she noticed that the patient was abnormal when she came on her shift around 330.  She noticed that the patient was weaker than normal.  She is normally walking independently but today was unable to walk by herself or transfer without assistance.  She is normally more more conversant as well.  She does not know when the patient's last known well is that she was not on shift prior to this.  She has not noticed any recent illnesses other than rhinorrhea today.  The patient has had no fevers, vomiting cough or other symptoms that she is aware of.  Past Medical History:  Diagnosis Date   Allergy    Hyperlipidemia    Thyroid disease    UTI (urinary tract infection)     Patient Active Problem List   Diagnosis Date Noted   Hypothyroidism 07/18/2011   Dyslipidemia 07/18/2011    History reviewed. No pertinent surgical history.   OB History   No obstetric history on file.     History reviewed. No pertinent family history.  Social History   Tobacco Use   Smoking status: Every Day    Types: Cigarettes   Smokeless tobacco: Never   Tobacco comments:    pt states she is  weaning off cigarettes; 6 weeks per carton   Substance Use Topics   Alcohol use: Yes    Comment:  1 to 2 a month     Home Medications Prior to Admission medications   Medication Sig Start Date End Date Taking? Authorizing Provider  levothyroxine (SYNTHROID, LEVOTHROID) 75 MCG tablet Take 1 tablet (75 mcg total) by mouth daily. 07/18/11   Gordy Savers, MD    Allergies    Patient has no known allergies.  Review of Systems   Review of Systems  Unable to perform ROS: Dementia   Physical Exam Updated Vital Signs BP (!) 148/47   Pulse 84   Temp 98.9 F (37.2 C) (Rectal)   Resp 18   Ht 5\' 6"  (1.676 m)   Wt 64.4 kg   SpO2 96%   BMI 22.92 kg/m   Physical Exam Vitals and nursing note reviewed.  Constitutional:      General: She is not in acute distress.    Appearance: She is well-developed.  HENT:     Head: Normocephalic and atraumatic.  Eyes:     Conjunctiva/sclera: Conjunctivae normal.  Cardiovascular:     Rate and Rhythm: Normal rate and regular rhythm.     Heart sounds: Normal heart sounds. No murmur heard. Pulmonary:     Effort: Pulmonary effort is normal. No respiratory  distress.     Breath sounds: Normal breath sounds. No wheezing, rhonchi or rales.  Abdominal:     General: Bowel sounds are normal.     Palpations: Abdomen is soft.     Tenderness: There is no abdominal tenderness. There is no guarding or rebound.  Musculoskeletal:     Cervical back: Neck supple.  Skin:    General: Skin is warm and dry.  Neurological:     Mental Status: She is alert.     Comments: Mental Status:  Alert, pleasantly demented, will not reliably follow commands, oriented to self only  Smile is symmetric. Left eye ptosis noted. Perrl. E[place]. Motor:  Moving all extremities     ED Results / Procedures / Treatments   Labs (all labs ordered are listed, but only abnormal results are displayed) Labs Reviewed  COMPREHENSIVE METABOLIC PANEL - Abnormal; Notable for the  following components:      Result Value   Glucose, Bld 123 (*)    GFR, Estimated 60 (*)    All other components within normal limits  CBG MONITORING, ED - Abnormal; Notable for the following components:   Glucose-Capillary 116 (*)    All other components within normal limits  RESP PANEL BY RT-PCR (FLU A&B, COVID) ARPGX2  CBC  BRAIN NATRIURETIC PEPTIDE  URINALYSIS, ROUTINE W REFLEX MICROSCOPIC  TROPONIN I (HIGH SENSITIVITY)  TROPONIN I (HIGH SENSITIVITY)    EKG None  Radiology DG CHEST PORT 1 VIEW  Result Date: 02/21/2021 CLINICAL DATA:  Altered mental status EXAM: PORTABLE CHEST 1 VIEW COMPARISON:  01/21/2019 FINDINGS: Pulmonary insufflation has diminished leading to vascular crowding at the hila. Small left pleural effusion has developed. No superimposed focal pulmonary infiltrate. No pneumothorax. Cardiac size within normal limits. Pulmonary vascularity is normal. No acute bone abnormality. IMPRESSION: Interval development of small left pleural effusion. Electronically Signed   By: Helyn Numbers M.D.   On: 02/21/2021 20:30    Procedures Procedures   Medications Ordered in ED Medications  sodium chloride 0.9 % bolus 500 mL (has no administration in time range)    ED Course  I have reviewed the triage vital signs and the nursing notes.  Pertinent labs & imaging results that were available during my care of the patient were reviewed by me and considered in my medical decision making (see chart for details).    MDM Rules/Calculators/A&P                          75 year old female presenting the emergency department today for evaluation of altered mental status.  Reviewed/interpreted labs CBC w/o leukocytosis  CMP is grossly unremarkable Trop negative BNP is negative  EKG   Reviewed/interpreted imaging CXR - Interval development of small left pleural effusion.  At shift change, pt pending CT head. Care transitioned to [facility], PA-C with plan to f/u on imaging,  covid test and likely admit for encephalopathy.  Final Clinical Impression(s) / ED Diagnoses Final diagnoses:  AMS (altered mental status)    Rx / DC Orders ED Discharge Orders     None        Rayne Du 02/21/21 2153    Lorre Nick, MD 02/21/21 2230

## 2021-02-21 NOTE — ED Notes (Signed)
Lab called, unable to run entirety of urine sample and can not release what they could run without the blood and chemistry portion.  Provider notified.

## 2021-02-22 LAB — URINALYSIS, ROUTINE W REFLEX MICROSCOPIC
Bilirubin Urine: NEGATIVE
Glucose, UA: NEGATIVE mg/dL
Ketones, ur: NEGATIVE mg/dL
Nitrite: POSITIVE — AB
Protein, ur: NEGATIVE mg/dL
Specific Gravity, Urine: >1.03 — ABNORMAL HIGH (ref 1.005–1.030)
pH: 6 (ref 5.0–8.0)

## 2021-02-22 LAB — RESP PANEL BY RT-PCR (FLU A&B, COVID) ARPGX2
Influenza A by PCR: NEGATIVE
Influenza B by PCR: NEGATIVE
SARS Coronavirus 2 by RT PCR: POSITIVE — AB

## 2021-02-22 MED ORDER — CEFDINIR 300 MG PO CAPS: 300.0000 mg | ORAL_CAPSULE | Freq: Two times a day (BID) | ORAL | 0 refills | Status: AC

## 2021-02-22 MED ORDER — SODIUM CHLORIDE 0.9 % IV SOLN
1.0000 g | Freq: Once | INTRAVENOUS | Status: AC
  Administered 2021-02-22: 1 g via INTRAVENOUS
  Filled 2021-02-22: qty 10

## 2021-02-22 NOTE — ED Notes (Signed)
Attempted to contact [facility] at [place] using several different numbers, all calls immediately forwarded to an automatic voicemail system.

## 2021-02-22 NOTE — ED Notes (Signed)
Called daughter to update her on recent results

## 2021-02-22 NOTE — Discharge Instructions (Signed)
Thank you for allowing me to care for you today in the Emergency Department.   You tested positive for COVID-19 today.  You were also found to have a urinary tract infection.  Take 1 tablet of cefdinir 2 times daily for the next 10 days.  Return to the emergency department for new or worsening symptoms.

## 2021-02-22 NOTE — ED Notes (Signed)
Contacted pt's daughter to obtain alternate means of contacting facility, does not have different numbers than the ones already attempted.

## 2021-02-22 NOTE — ED Notes (Signed)
[facility] contacted for transportation.  

## 2021-02-22 NOTE — ED Notes (Signed)
Ambulated pt around room with walker and three person assist.  Pt unsteady to stand initially.  Once walking with walker pt became more steady.  Followed commands.

## 2021-02-24 LAB — URINE CULTURE: Culture: >=100000 — AB

## 2021-02-25 ENCOUNTER — Telehealth: Payer: Self-pay

## 2021-02-25 NOTE — Progress Notes (Signed)
ED Antimicrobial Stewardship Positive Culture Follow Up   Anne Wiggins is an 75 y.o. female who presented to [facility] on 02/21/2021 with a chief complaint of weakness.   Chief Complaint  Patient presents with   Weakness    Recent Results (from the past 720 hour(s))  Resp Panel by RT-PCR (Flu A&B, Covid) Nasopharyngeal Swab     Status: Abnormal   Collection Time: 02/21/21  8:46 PM   Specimen: Nasopharyngeal Swab; Nasopharyngeal(NP) swabs in vial transport medium  Result Value Ref Range Status   SARS Coronavirus 2 by RT PCR POSITIVE (A) NEGATIVE Final    Comment: RESULT CALLED TO, READ BACK BY AND VERIFIED WITH: [name] RN.@[email or web address]  ON 9.6.22 BY TCALDWELL MT. (NOTE) SARS-CoV-2 target nucleic acids are DETECTED.  The SARS-CoV-2 RNA is generally detectable in upper respiratory specimens during the acute phase of infection. Positive results are indicative of the presence of the identified virus, but do not rule out bacterial infection or co-infection with other pathogens not detected by the test. Clinical correlation with patient history and other diagnostic information is necessary to determine patient infection status. The expected result is Negative.  Fact Sheet for Patients: BloggerCourse.com  Fact Sheet for Healthcare Providers: SeriousBroker.it  This test is not yet approved or cleared by the [place] FDA and  has been authorized for detection and/or diagnosis of SARS-CoV-2 by FDA under an Emergency Use Authorization (EUA).  This EUA will remain in effect (meaning t his test can be used) for the duration of  the COVID-19 declaration under Section 564(b)(1) of the Act, 21 U.S.C. section 360bbb-3(b)(1), unless the authorization is terminated or revoked sooner.     Influenza A by PCR NEGATIVE NEGATIVE Final   Influenza B by PCR NEGATIVE NEGATIVE Final    Comment: (NOTE) The Xpert Xpress SARS-CoV-2/FLU/RSV plus assay  is intended as an aid in the diagnosis of influenza from Nasopharyngeal swab specimens and should not be used as a sole basis for treatment. Nasal washings and aspirates are unacceptable for Xpert Xpress SARS-CoV-2/FLU/RSV testing.  Fact Sheet for Patients: BloggerCourse.com  Fact Sheet for Healthcare Providers: SeriousBroker.it  This test is not yet approved or cleared by the [place] FDA and has been authorized for detection and/or diagnosis of SARS-CoV-2 by FDA under an Emergency Use Authorization (EUA). This EUA will remain in effect (meaning this test can be used) for the duration of the COVID-19 declaration under Section 564(b)(1) of the Act, 21 U.S.C. section 360bbb-3(b)(1), unless the authorization is terminated or revoked.  Performed at [facility], [address] [place], [place] [address]   Urine Culture     Status: Abnormal   Collection Time: 02/21/21 11:30 PM   Specimen: Urine, Clean Catch  Result Value Ref Range Status   Specimen Description   Final    URINE, CLEAN CATCH Performed at [facility], [address] [place], [place] [address]    Special Requests   Final    NONE Performed at [facility], [address] [place], [place] [address]    Culture >=100,000 COLONIES/mL ESCHERICHIA COLI (A)  Final   Report Status 02/24/2021 FINAL  Final   Organism ID, Bacteria ESCHERICHIA COLI (A)  Final      Susceptibility   Escherichia coli - MIC*    AMPICILLIN 8 SENSITIVE Sensitive     CEFAZOLIN <=4 SENSITIVE Sensitive     CEFEPIME <=0.12 SENSITIVE Sensitive     CEFTRIAXONE <=0.25 SENSITIVE Sensitive  CIPROFLOXACIN <=0.25 SENSITIVE Sensitive     GENTAMICIN <=1 SENSITIVE Sensitive     IMIPENEM <=0.25 SENSITIVE Sensitive     NITROFURANTOIN 32 SENSITIVE Sensitive     TRIMETH/SULFA <=20 SENSITIVE Sensitive     AMPICILLIN/SULBACTAM 4 SENSITIVE Sensitive      PIP/TAZO <=4 SENSITIVE Sensitive     * >=100,000 COLONIES/mL ESCHERICHIA COLI   Anne Wiggins reported to the ED with complaints of weakness. She states she was normally able to walk independently though when she arrived in the ED she was unable to walk without assistance. She denied any fever or urinary symptoms. UA was positive for nitrites, trace leukocytes, rare bacteria and 6-10 WBC and her UC grew >100,000 colonies of E. Coli.   Plan: - STOP Cefdinir 300 mg PO BID, after Saturday, 02/26/2021   ED Provider: Edwin Dada, DO   Johnston Ebbs, Student Pharmacist

## 2021-02-25 NOTE — Telephone Encounter (Signed)
Post ED Visit - Positive Culture Follow-up: Successful Patient Follow-Up  Culture assessed and recommendations reviewed by:  [x]  , Student Pharm.D. []  Johnston Ebbs, Pharm.D., BCPS AQ-ID []  , Pharm.D., BCPS []  Celedonio Miyamoto, Pharm.D., BCPS []  [place], Garvin Fila.D., BCPS, AAHIVP []  , Pharm.D., BCPS, AAHIVP []  Georgina Pillion, PharmD, BCPS []  , PharmD, BCPS []  [place], PharmD, BCPS []  [address], PharmD  Positive urine culture  Asymptomatic urinary symptoms in ED. please contact [facility] at [facility] and ask them to stop Cefdinir after Saturday, February 26, 2021.   Changes discussed with ED provider: Lysle Pearl, DO  Called and faxed to [facility] at [place], date 02/25/2021, time 12:55pm   02/25/2021, 12:57 PM

## 2021-05-06 ENCOUNTER — Encounter (HOSPITAL_COMMUNITY): Payer: Self-pay | Admitting: Emergency Medicine

## 2021-05-06 ENCOUNTER — Emergency Department (HOSPITAL_COMMUNITY): Payer: Medicare HMO

## 2021-05-06 ENCOUNTER — Other Ambulatory Visit: Payer: Self-pay

## 2021-05-06 ENCOUNTER — Emergency Department (HOSPITAL_COMMUNITY)
Admission: EM | Admit: 2021-05-06 | Discharge: 2021-05-07 | Disposition: A | Discharge status: Home / Self Care | Payer: Medicare HMO | Attending: Emergency Medicine | Admitting: Emergency Medicine

## 2021-05-06 DIAGNOSIS — K7689 Other specified diseases of liver: Secondary | ICD-10-CM | POA: Diagnosis not present

## 2021-05-06 DIAGNOSIS — R0602 Shortness of breath: Secondary | ICD-10-CM | POA: Insufficient documentation

## 2021-05-06 DIAGNOSIS — F0390 Unspecified dementia without behavioral disturbance: Secondary | ICD-10-CM | POA: Diagnosis not present

## 2021-05-06 DIAGNOSIS — K862 Cyst of pancreas: Secondary | ICD-10-CM | POA: Diagnosis not present

## 2021-05-06 DIAGNOSIS — R1013 Epigastric pain: Secondary | ICD-10-CM | POA: Diagnosis present

## 2021-05-06 DIAGNOSIS — F17210 Nicotine dependence, cigarettes, uncomplicated: Secondary | ICD-10-CM | POA: Insufficient documentation

## 2021-05-06 DIAGNOSIS — Z79899 Other long term (current) drug therapy: Secondary | ICD-10-CM | POA: Insufficient documentation

## 2021-05-06 DIAGNOSIS — R0789 Other chest pain: Secondary | ICD-10-CM | POA: Diagnosis not present

## 2021-05-06 DIAGNOSIS — E876 Hypokalemia: Secondary | ICD-10-CM | POA: Insufficient documentation

## 2021-05-06 DIAGNOSIS — E039 Hypothyroidism, unspecified: Secondary | ICD-10-CM | POA: Insufficient documentation

## 2021-05-06 LAB — TROPONIN I (HIGH SENSITIVITY)
Troponin I (High Sensitivity): 10 ng/L (ref <18)
Troponin I (High Sensitivity): 10 ng/L (ref <18)

## 2021-05-06 LAB — CBC WITH DIFFERENTIAL/PLATELET
Abs Immature Granulocytes: 0.04 10*3/uL (ref 0.00–0.07)
Basophils Absolute: 0.1 10*3/uL (ref 0.0–0.1)
Basophils Relative: 1 %
Eosinophils Absolute: 0 10*3/uL (ref 0.0–0.5)
Eosinophils Relative: 0 %
HCT: 44 % (ref 36.0–46.0)
Hemoglobin: 14.1 g/dL (ref 12.0–15.0)
Immature Granulocytes: 0 %
Lymphocytes Relative: 11 %
Lymphs Abs: 1.2 10*3/uL (ref 0.7–4.0)
MCH: 29.6 pg (ref 26.0–34.0)
MCHC: 32 g/dL (ref 30.0–36.0)
MCV: 92.2 fL (ref 80.0–100.0)
Monocytes Absolute: 0.9 10*3/uL (ref 0.1–1.0)
Monocytes Relative: 8 %
Neutro Abs: 8.7 10*3/uL — ABNORMAL HIGH (ref 1.7–7.7)
Neutrophils Relative %: 80 %
Platelets: 269 10*3/uL (ref 150–400)
RBC: 4.77 MIL/uL (ref 3.87–5.11)
RDW: 14.7 % (ref 11.5–15.5)
WBC: 10.8 10*3/uL — ABNORMAL HIGH (ref 4.0–10.5)
nRBC: 0 % (ref 0.0–0.2)

## 2021-05-06 LAB — COMPREHENSIVE METABOLIC PANEL
ALT: 17 U/L (ref 0–44)
AST: 24 U/L (ref 15–41)
Albumin: 3.7 g/dL (ref 3.5–5.0)
Alkaline Phosphatase: 85 U/L (ref 38–126)
Anion gap: 9 (ref 5–15)
BUN: 13 mg/dL (ref 8–23)
CO2: 27 mmol/L (ref 22–32)
Calcium: 9 mg/dL (ref 8.9–10.3)
Chloride: 103 mmol/L (ref 98–111)
Creatinine, Ser: 0.82 mg/dL (ref 0.44–1.00)
GFR, Estimated: >60 mL/min (ref >60)
Glucose, Bld: 119 mg/dL — ABNORMAL HIGH (ref 70–99)
Potassium: 3.4 mmol/L — ABNORMAL LOW (ref 3.5–5.1)
Sodium: 139 mmol/L (ref 135–145)
Total Bilirubin: 0.9 mg/dL (ref 0.3–1.2)
Total Protein: 6.7 g/dL (ref 6.5–8.1)

## 2021-05-06 LAB — URINALYSIS, ROUTINE W REFLEX MICROSCOPIC
Bilirubin Urine: NEGATIVE
Glucose, UA: NEGATIVE mg/dL
Hgb urine dipstick: NEGATIVE
Ketones, ur: NEGATIVE mg/dL
Leukocytes,Ua: NEGATIVE
Nitrite: NEGATIVE
Protein, ur: NEGATIVE mg/dL
Specific Gravity, Urine: >1.046 — ABNORMAL HIGH (ref 1.005–1.030)
pH: 6 (ref 5.0–8.0)

## 2021-05-06 LAB — BRAIN NATRIURETIC PEPTIDE: B Natriuretic Peptide: 124.2 pg/mL — ABNORMAL HIGH (ref 0.0–100.0)

## 2021-05-06 LAB — LIPASE, BLOOD: Lipase: 27 U/L (ref 11–51)

## 2021-05-06 MED ORDER — IOHEXOL 300 MG/ML  SOLN
100.0000 mL | Freq: Once | INTRAMUSCULAR | Status: AC | PRN
  Administered 2021-05-06: 100 mL via INTRAVENOUS

## 2021-05-06 MED ORDER — ACETAMINOPHEN 325 MG PO TABS
650.0000 mg | ORAL_TABLET | Freq: Once | ORAL | Status: DC
  Filled 2021-05-06: qty 2

## 2021-05-06 NOTE — ED Notes (Signed)
Lab will add BNP

## 2021-05-06 NOTE — ED Notes (Signed)
The pt has a sitter at  the bedside  the pt has dementia

## 2021-05-06 NOTE — ED Notes (Signed)
RN spoke with daughter Anne Wiggins). I provided an update on pt care. Anne Wiggins stated if there is a medication that is important for pt to have to call her on speaker phone so she can assist with pt taking medication. Anne Wiggins stated that pt has a high tolerance with pain, and she may not express she is in pain at times. When she was alerted from [place] facility, she was informed pt stated she was in pain which alarmed daughter. Anne Wiggins stated to keep her informed and she is currently in [facility]. Anne Wiggins will try to reach out to brother if family support is needed in person. Informed Anne Wiggins pt is resting and showing no signs of distress.

## 2021-05-06 NOTE — ED Notes (Signed)
Provided pt beverage 

## 2021-05-06 NOTE — ED Notes (Signed)
Report given to management  at [facility]

## 2021-05-06 NOTE — Discharge Instructions (Addendum)
Dear Anne Wiggins,  Thank you for allowing [place] to take care of you today.  We hope you begin feeling better soon.  - Please follow-up with your primary care physician or schedule an appointment to establish a primary care doctor if you do not have one already. - Please return to the Emergency Department or call 911 for chest pain, shortness of breath, severe pain, altered mental status, or if you have any reason to think you may need emergency medical care. - Follow-up closely with your PCP -- Take MiraLAX as directed: 17 g (~1 heaping tablespoon) dissolved in 120 to 240 mL (4 to 8 ounces) of beverage once daily; do not use for >1 to 2 weeks (product specific)  - May consider a using bulk forming laxative such as Metamucil as directed - May consider using an over-the-counter glycerin suppository occasionally for hard stools as directed. - Remember to stay well hydrated. Increase fluid intake with water, not tea, coffee, soda, or alcohol, which can lead to dehydration. - Increase dietary fiber, including fruits, vegetables, beans, and whole grains. Slowly integrate these foods if you do not regularly eat them, as it can lead to increased gas production. - Follow up with your PCP to discuss long-term constipation management.    Sincerely,  Dwaine Gale, DO Department of Emergency Medicine [place]   Epigastric pain

## 2021-05-06 NOTE — ED Triage Notes (Signed)
Pt BIB [facility] from [facility]. Complaint of upper abdominal pain. Complains of pain upon touch as well as upon deep breathing and movement. VSS. NAD. Alert to baseline.

## 2021-05-06 NOTE — ED Notes (Signed)
Provided pt snack and beverage

## 2021-05-06 NOTE — ED Notes (Signed)
Patient transported to CT 

## 2021-05-06 NOTE — ED Notes (Signed)
Got patient undressed into  GOWN ON THE MONITOR DID EKG SHOWN TO Dr Shanna Cisco patient is resting with call bell in reach

## 2021-05-06 NOTE — ED Provider Notes (Addendum)
[facility] EMERGENCY DEPARTMENT Provider Note   CSN: [id number] Arrival date & time: 05/06/21  1014     History Chief Complaint  Patient presents with   Abdominal Pain    Anne Wiggins is a 75 y.o. female.  HPI Level 5 caveat secondary to dementia.  75 year old female with a history of dementia hyperlipidemia, thyroid disease taking levothyroxine, dyslipidemia presents to the ER with complaints of chest pain/question epigastric pain.   I spoke with staff at her nursing facility [facility] who state that the patient had complained of chest pain and some shortness of breath this morning when she woke up.  She has no prior cardiac history.  No fevers, chills, cough, no URI symptoms.  EMS reported epigastric pain.  Patient is not able to tell me exactly where she hurts but states it has been hurting for "too damn long".  Facility denies any recent falls or injuries.  Past Medical History:  Diagnosis Date   Allergy    Hyperlipidemia    Thyroid disease    UTI (urinary tract infection)     Patient Active Problem List   Diagnosis Date Noted   Hypothyroidism 07/18/2011   Dyslipidemia 07/18/2011    History reviewed. No pertinent surgical history.   OB History   No obstetric history on file.     History reviewed. No pertinent family history.  Social History   Tobacco Use   Smoking status: Every Day    Types: Cigarettes   Smokeless tobacco: Never   Tobacco comments:    pt states she is weaning off cigarettes; 6 weeks per carton   Substance Use Topics   Alcohol use: Yes    Comment:  1 to 2 a month     Home Medications Prior to Admission medications   Medication Sig Start Date End Date Taking? Authorizing Provider  acetaminophen (TYLENOL) 325 MG tablet Take 650 mg by mouth every 6 (six) hours as needed for moderate pain.   Yes [provider]  buPROPion (WELLBUTRIN XL) 300 MG 24 hr tablet Take 300 mg by mouth daily. 04/28/21  Yes  [provider]  Calcium Carb-Cholecalciferol 500-10 MG-MCG CHEW Chew 1 tablet by mouth daily.   Yes [provider]  levothyroxine (SYNTHROID) 175 MCG tablet Take 175 mcg by mouth daily before breakfast.   Yes [provider]  melatonin 5 MG TABS Take 5 mg by mouth at bedtime.   Yes [provider]  memantine (NAMENDA) 10 MG tablet Take 10 mg by mouth 2 (two) times daily. 04/28/21  Yes [provider]  Multiple Vitamins-Minerals (CENTRUM SILVER 50+WOMEN) TABS Take 1 tablet by mouth daily.   Yes [provider]  QUEtiapine (SEROQUEL) 50 MG tablet Take 50 mg by mouth 2 (two) times daily.   Yes [provider]  senna-docusate (SENOKOT-S) 8.6-50 MG tablet Take 2 tablets by mouth 2 (two) times daily.   Yes [provider]  sertraline (ZOLOFT) 100 MG tablet Take 150 mg by mouth daily. 04/28/21  Yes [provider]  vitamin C (ASCORBIC ACID) 500 MG tablet Take 500 mg by mouth daily.   Yes [provider]  Vitamin D, Cholecalciferol, 25 MCG (1000 UT) CAPS Take 1 capsule by mouth daily.   Yes [provider]  levothyroxine (SYNTHROID, LEVOTHROID) 75 MCG tablet Take 1 tablet (75 mcg total) by mouth daily. Patient not taking: Reported on 05/06/2021 07/18/11   Gordy Savers, MD    Allergies  Patient has no known allergies.  Review of Systems   Review of Systems Ten systems reviewed and are negative for acute change, except as noted in the HPI.   Physical Exam Updated Vital Signs BP (!) 151/67   Pulse 82   Temp 99.5 F (37.5 C) (Oral)   Resp 19   SpO2 98%   Physical Exam Constitutional:      Appearance: Normal appearance.     Comments: Pleasantly confused, A&Ox2  HENT:     Head: Normocephalic and atraumatic.     Mouth/Throat:     Mouth: Mucous membranes are moist.  Eyes:     Pupils: Pupils are equal, round, and reactive to light.  Cardiovascular:     Rate and Rhythm: Normal rate  and regular rhythm.  Pulmonary:     Effort: Pulmonary effort is normal.     Breath sounds: Normal breath sounds. No wheezing.  Chest:       Comments: Reproducible inferior xiphoid and chest wall tenderness.  No visible bruising, flail chest, deformities.  Abdominal:     General: Abdomen is flat.     Tenderness: There is no abdominal tenderness.     Comments: Mid and lower abdomen soft and nontender  Musculoskeletal:        General: Tenderness present. Normal range of motion.     Cervical back: Normal range of motion.  Skin:    General: Skin is warm.  Neurological:     General: No focal deficit present.     Mental Status: She is alert and oriented to person, place, and time.  Psychiatric:        Mood and Affect: Mood normal.        Behavior: Behavior normal.     ED Results / Procedures / Treatments   Labs (all labs ordered are listed, but only abnormal results are displayed) Labs Reviewed  CBC WITH DIFFERENTIAL/PLATELET - Abnormal; Notable for the following components:      Result Value   WBC 10.8 (*)    Neutro Abs 8.7 (*)    All other components within normal limits  COMPREHENSIVE METABOLIC PANEL - Abnormal; Notable for the following components:   Potassium 3.4 (*)    Glucose, Bld 119 (*)    All other components within normal limits  BRAIN NATRIURETIC PEPTIDE - Abnormal; Notable for the following components:   B Natriuretic Peptide 124.2 (*)    All other components within normal limits  RESP PANEL BY RT-PCR (FLU A&B, COVID) ARPGX2  LIPASE, BLOOD  URINALYSIS, ROUTINE W REFLEX MICROSCOPIC  TROPONIN I (HIGH SENSITIVITY)  TROPONIN I (HIGH SENSITIVITY)    EKG EKG Interpretation  Date/Time:  Friday May 06 2021 10:19:44 EST Ventricular Rate:  84 PR Interval:  154 QRS Duration: 102 QT Interval:  430 QTC Calculation: 509 R Axis:   38 Text Interpretation: Sinus rhythm Nonspecific repol abnormality, inferior leads Prolonged QT interval agree, no sig change from  previous Confirmed by Arby Barrette [phone]) on 05/06/2021 11:06:40 AM  Radiology CT ABDOMEN PELVIS W CONTRAST  Result Date: 05/06/2021 CLINICAL DATA:  Epigastric pain. EXAM: CT ABDOMEN AND PELVIS WITH CONTRAST TECHNIQUE: Multidetector CT imaging of the abdomen and pelvis was performed using the standard protocol following bolus administration of intravenous contrast. CONTRAST:  OMNIPAQUE IOHEXOL 300 MG/ML  SOLN COMPARISON:  None. FINDINGS: Lower chest: Atelectasis and ground-glass attenuation identified within both lung bases posteriorly. Hepatobiliary: Liver cysts as well as several too small to reliably characterize subcentimeter low-density liver lesions  noted. No suspicious liver lesions identified at this time. Gallbladder appears normal. No intrahepatic bile duct dilatation. Common bile duct is normal in caliber. Pancreas: There is mild diffuse increase caliber of the main pancreatic duct which measures up to 3 mm, image 30/3. Cystic lesion within the tail of pancreas measures 2.3 by 1.4 cm, image 27/3. Previously reported measuring 8 mm on CT from 03/29/2025. Spleen: Normal in size without focal abnormality. Adrenals/Urinary Tract: Normal appearance of the right adrenal gland. Asymmetric enlargement of the left adrenal gland with maintenance of its adreniform shape. This finding was also reported on remote CT from 03/30/2015. No kidney mass or hydronephrosis identified. The urinary bladder appears distended but is partially obscured due to artifact from patient's left hip arthroplasty device. No focal bladder abnormality noted within this limitation. Stomach/Bowel: Stomach appears normal. No signs of bowel wall thickening, inflammation, or distension. The appendix is not confidently identified. Vascular/Lymphatic: Extensive aortic atherosclerosis with branch vessel involvement. 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 No signs of abdominopelvic  adenopathy. Reproductive: Uterus and bilateral adnexa are unremarkable. Other: No ascites or focal fluid collections identified. Musculoskeletal: Previous left hip arthroplasty. Age-indeterminate compression fractures are identified at L1, L2 and L5. Here there is loss of none approximately 40% of the vertebral body heights. IMPRESSION: 1. No acute findings identified within the abdomen or pelvis. 2. 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 By report, this has increased in size from 03/29/2025. This does not require emergent attention. When the patient is clinically stable and able to follow directions and hold their breath (preferably as an outpatient) further evaluation with dedicated contrast enhanced abdominal MRI should be considered. 3. Age-indeterminate compression fractures at L1, L2 and L5. 4. Aortic Atherosclerosis (ICD10-I70.0). Electronically Signed   By: Signa Kell M.D.   On: 05/06/2021 14:35   DG Chest Portable 1 View  Result Date: 05/06/2021 CLINICAL DATA:  Epigastric pain EXAM: PORTABLE CHEST 1 VIEW COMPARISON:  02/21/2021 FINDINGS: Heart size and vascularity normal.  Atherosclerotic aortic arch. Mild atelectasis in the lung bases. No focal infiltrate or effusion. IMPRESSION: Mild bibasilar atelectasis. Electronically Signed   By: Marlan Palau M.D.   On: 05/06/2021 11:08    Procedures Procedures   Medications Ordered in ED Medications  acetaminophen (TYLENOL) tablet 650 mg (650 mg Oral Not Given 05/06/21 1143)  iohexol (OMNIPAQUE) 300 MG/ML solution 100 mL (100 mLs Intravenous Contrast Given 05/06/21 1421)    ED Course  I have reviewed the triage vital signs and the nursing notes.  Pertinent labs & imaging results that were available during my care of the patient were reviewed by me and considered in my medical decision making (see chart for details).  Clinical Course as of 05/06/21 1522  Fri May 06, 2021  4913 75 year old female with complaints of  epigastric/chest pain.  On arrival, she is well-appearing, no acute distress, resting comfortably in the ER bed.  Vitals overall reassuring, she is slightly hypertensive with a blood pressure 149/84, temp of 99.5, not tachycardic, tachypneic or hypoxic.  Physical exam with reproducible sternal and xiphoid tenderness.  No visible bruising, erythema, deformities.  DDx includes ACS, costochondritis, musculoskeletal pain, lower suspicion for PE given no tachycardia, no leg swelling, no pleuritic symptoms.  No lower extremity edema to suggest heart failure  Labs and imaging ordered, reviewed and interpreted by me.  CBC with a mild leukocytosis of 10.8.  Pending UA, troponin, lipase and CMP.  Tylenol  given for pain. [MB]  1341 CMP with mild hypokalemia, normal LFTs, bilirubin, no significant Electra normalities noted.  Normal lipase.  Initial troponin 10.  Nursing facility reports that the patient had endorsed chest pain, however to me she continues to complain of abdominal pain.  She has some generalized tenderness throughout with no evidence of pain out of proportion to exam, peritoneal signs, no guarding.  Pending CT of the abdomen. [MB]  1450 CT ABDOMEN PELVIS W CONTRAST IMPRESSION: 1. No acute findings identified within the abdomen or pelvis. 2. 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 By report, this has increased in size from 03/29/2025. This does not require emergent attention. When the patient is clinically stable and able to follow directions and hold their breath (preferably as an outpatient) further evaluation with dedicated contrast enhanced abdominal MRI should be considered. 3. Age-indeterminate compression fractures at L1, L2 and L5. 4. Aortic Atherosclerosis (ICD10-I70.0).   [MB]  1451 CT of the abdomen pelvis overall reassuring, she was noted to haveAneurysmal dilatation of the right common iliac artery measures 1.9 cm.  Low suspicion for dissection at this time.   Patient refusing Tylenol for pain.  Plan for followup up on UA. I did review her CT and she did have a moderate stool burden.    [MB]  1507 Care signed out to oncoming team for followup on UA. Daughter [name] [facility] updated. Encouraged laxative use at home. No signs of bowel obstruction. Suspect she will be stable for discharge.  [MB]  1516 This was a shared visit with my supervising physician Dr. Donnald Garre who independently saw and evaluated the patient & provided guidance in evaluation/management/disposition ,in agreement with care  [MB]    Clinical Course User Index [MB] Leone Brand   MDM Rules/Calculators/A&P                            Final Clinical Impression(s) / ED Diagnoses Final diagnoses:  None    Rx / DC Orders ED Discharge Orders     None           Leone Brand 05/06/21 1523    Arby Barrette, MD 05/06/21 [phone]

## 2021-05-06 NOTE — ED Provider Notes (Signed)
Physical Exam  BP (!) 145/96   Pulse 94   Temp 99.5 F (37.5 C) (Oral)   Resp 17   SpO2 (!) 86%   Physical Exam  ED Course/Procedures   Clinical Course as of 05/07/21 0057  Fri May 06, 2021  1366 75 year old female with complaints of epigastric/chest pain.  On arrival, she is well-appearing, no acute distress, resting comfortably in the ER bed.  Vitals overall reassuring, she is slightly hypertensive with a blood pressure 149/84, temp of 99.5, not tachycardic, tachypneic or hypoxic.  Physical exam with reproducible sternal and xiphoid tenderness.  No visible bruising, erythema, deformities.  DDx includes ACS, costochondritis, musculoskeletal pain, lower suspicion for PE given no tachycardia, no leg swelling, no pleuritic symptoms.  No lower extremity edema to suggest heart failure  Labs and imaging ordered, reviewed and interpreted by me.  CBC with a mild leukocytosis of 10.8.  Pending UA, troponin, lipase and CMP.  Tylenol given for pain. [MB]  1341 CMP with mild hypokalemia, normal LFTs, bilirubin, no significant Electra normalities noted.  Normal lipase.  Initial troponin 10.  Nursing facility reports that the patient had endorsed chest pain, however to me she continues to complain of abdominal pain.  She has some generalized tenderness throughout with no evidence of pain out of proportion to exam, peritoneal signs, no guarding.  Pending CT of the abdomen. [MB]  1450 CT ABDOMEN PELVIS W CONTRAST IMPRESSION: 1. No acute findings identified within the abdomen or pelvis. 2. 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 By report, this has increased in size from 03/29/2025. This does not require emergent attention. When the patient is clinically stable and able to follow directions and hold their breath (preferably as an outpatient) further evaluation with dedicated contrast enhanced abdominal MRI should be considered. 3. Age-indeterminate compression fractures at L1, L2 and  L5. 4. Aortic Atherosclerosis (ICD10-I70.0).   [MB]  1451 CT of the abdomen pelvis overall reassuring, she was noted to haveAneurysmal dilatation of the right common iliac artery measures 1.9 cm.  Low suspicion for dissection at this time.  Patient refusing Tylenol for pain.  Plan for followup up on UA. I did review her CT and she did have a moderate stool burden.    [MB]  1507 Care signed out to oncoming team for followup on UA. Daughter [name] [facility] updated. Encouraged laxative use at home. No signs of bowel obstruction. Suspect she will be stable for discharge.  [MB]  1516 This was a shared visit with my supervising physician Dr. Donnald Garre who independently saw and evaluated the patient & provided guidance in evaluation/management/disposition ,in agreement with care  [MB]    Clinical Course User Index [MB] Mare Ferrari, PA-C    Procedures  MDM  Care of patient assumed from previous provider at shift change. Please refer to their note for further history and plan.  In brief, patient is a 75 y.o. y/o female presenting with: - abdominal pain - PMHx: Past Medical History:  Diagnosis Date   Allergy    Hyperlipidemia    Thyroid disease    UTI (urinary tract infection)     Review of ED course: - unremarkable exam, signs of constipation on exam   Plan at the time of handoff is as follows: - f/u pending UA, likely d/c home   Additional MDM:  VS upon my assumption of care were sig for HTN. No further workup during my care.  Labs: UA with elevated specific gravity, otherwise  unremarkable.   Medications: Medications  acetaminophen (TYLENOL) tablet 650 mg (650 mg Oral Not Given 05/06/21 1143)  iohexol (OMNIPAQUE) 300 MG/ML solution 100 mL (100 mLs Intravenous Contrast Given 05/06/21 1421)   Patient re-evaluated prior to discharge. Hemodynamically stable and in no acute distress.  Discharged home in stable condition. Strict ED return precautions advised. Supportive care  discussed. Advised close PCP follow up.  Constipation instructions provided.  Family understands and agrees with the plan.   The plan for this patient was discussed with my attending physician, who voiced agreement and who oversaw evaluation and treatment of this patient.     Note: Chief Executive Officer was used in the creation of this note.   1. Epigastric pain          Dwaine Gale, DO 05/07/21 0057    Gerhard Munch, MD 05/08/21 2342

## 2021-05-06 NOTE — ED Notes (Signed)
Ambulated pt to restroom  

## 2021-05-07 NOTE — ED Notes (Signed)
Ptar here  pt being transported back to [facility].

## 2021-05-07 NOTE — ED Notes (Signed)
Sitter at the bedside  still waiting on ptar to take back to [facility]

## 2021-05-07 NOTE — ED Notes (Signed)
Pt still waiting for ptar apparentlly a call was never placed  by the other nurse.  The sitter with her left at 0300am the pt has dementia has to be watched closely

## 2022-02-28 ENCOUNTER — Emergency Department (HOSPITAL_COMMUNITY): Payer: Medicare HMO

## 2022-02-28 ENCOUNTER — Emergency Department (HOSPITAL_COMMUNITY)
Admission: EM | Admit: 2022-02-28 | Discharge: 2022-02-28 | Disposition: A | Discharge status: Skilled Nursing Facility | Payer: Medicare HMO | Attending: Emergency Medicine | Admitting: Emergency Medicine

## 2022-02-28 DIAGNOSIS — U071 COVID-19: Secondary | ICD-10-CM | POA: Diagnosis not present

## 2022-02-28 DIAGNOSIS — R0602 Shortness of breath: Secondary | ICD-10-CM | POA: Diagnosis present

## 2022-02-28 MED ORDER — MOLNUPIRAVIR EUA 200MG CAPSULE: 4.0000 | ORAL_CAPSULE | Freq: Two times a day (BID) | ORAL | 0 refills | Status: AC

## 2022-02-28 MED ORDER — DOXYCYCLINE HYCLATE 100 MG PO CAPS: 100.0000 mg | ORAL_CAPSULE | Freq: Two times a day (BID) | ORAL | 0 refills | Status: DC

## 2022-02-28 MED ORDER — AMOXICILLIN 500 MG PO CAPS: 1000.0000 mg | ORAL_CAPSULE | Freq: Two times a day (BID) | ORAL | 0 refills | Status: AC

## 2022-02-28 NOTE — ED Provider Notes (Signed)
[facility]-EMERGENCY DEPT Provider Note   CSN: [id number] Arrival date & time: 02/28/22  1241     History  Chief Complaint  Patient presents with   Covid Positive    Anne Wiggins is a 76 y.o. female.  76 year old female presents from nursing home after testing COVID-positive.  Nursing home felt that maybe slight slightly short of breath.  EMS was called and patient had normal pulse ox and respiratory rate.  She is in no acute distress at this time.  Patient states that she feels at her baseline       Home Medications Prior to Admission medications   Medication Sig Start Date End Date Taking? Authorizing Provider  acetaminophen (TYLENOL) 325 MG tablet Take 650 mg by mouth every 6 (six) hours as needed for moderate pain.    [provider]  buPROPion (WELLBUTRIN XL) 300 MG 24 hr tablet Take 300 mg by mouth daily. 04/28/21   [provider]  Calcium Carb-Cholecalciferol 500-10 MG-MCG CHEW Chew 1 tablet by mouth daily.    [provider]  levothyroxine (SYNTHROID) 175 MCG tablet Take 175 mcg by mouth daily before breakfast.    [provider]  levothyroxine (SYNTHROID, LEVOTHROID) 75 MCG tablet Take 1 tablet (75 mcg total) by mouth daily. Patient not taking: Reported on 05/06/2021 07/18/11   Gordy Savers, MD  melatonin 5 MG TABS Take 5 mg by mouth at bedtime.    [provider]  memantine (NAMENDA) 10 MG tablet Take 10 mg by mouth 2 (two) times daily. 04/28/21   [provider]  Multiple Vitamins-Minerals (CENTRUM SILVER 50+WOMEN) TABS Take 1 tablet by mouth daily.    [provider]  QUEtiapine (SEROQUEL) 50 MG tablet Take 50 mg by mouth 2 (two) times daily.    [provider]  senna-docusate (SENOKOT-S) 8.6-50 MG tablet Take 2 tablets by mouth 2 (two) times daily.    [provider]  sertraline (ZOLOFT) 100 MG tablet Take 150 mg by mouth daily. 04/28/21   [provider]  vitamin C (ASCORBIC ACID) 500 MG tablet Take 500 mg by mouth daily.    [provider]  Vitamin D, Cholecalciferol, 25 MCG (1000 UT) CAPS Take 1 capsule by mouth daily.    [provider]      Allergies    Patient has no known allergies.    Review of Systems   Review of Systems  All other systems reviewed and are negative.   Physical Exam Updated Vital Signs BP (!) 145/64   Pulse 73   Temp 97.7 F (36.5 C) (Oral)   Resp 18   SpO2 94%  Physical Exam Vitals and nursing note reviewed.  Constitutional:      General: She is not in acute distress.    Appearance: Normal appearance. She is well-developed. She is not toxic-appearing.  HENT:     Head: Normocephalic and atraumatic.  Eyes:     General: Lids are normal.     Conjunctiva/sclera: Conjunctivae normal.     Pupils: Pupils are equal, round, and reactive to light.  Neck:     Thyroid: No thyroid mass.     Trachea: No tracheal deviation.  Cardiovascular:     Rate and Rhythm: Normal rate and regular rhythm.     Heart sounds: Normal heart sounds. No murmur heard.    No gallop.  Pulmonary:     Effort: Pulmonary effort is normal. No respiratory distress.     Breath  sounds: Normal breath sounds. No stridor. No decreased breath sounds, wheezing, rhonchi or rales.  Abdominal:     General: There is no distension.     Palpations: Abdomen is soft.     Tenderness: There is no abdominal tenderness. There is no rebound.  Musculoskeletal:        General: No tenderness. Normal range of motion.     Cervical back: Normal range of motion and neck supple.  Skin:    General: Skin is warm and dry.     Findings: No abrasion or rash.  Neurological:     Mental Status: She is alert and oriented to person, place, and time. Mental status is at baseline.     GCS: GCS eye subscore is 4. GCS verbal subscore is 5. GCS motor subscore is 6.     Cranial Nerves: No cranial nerve deficit.     Sensory: No sensory  deficit.     Motor: Motor function is intact.  Psychiatric:        Attention and Perception: Attention normal.        Speech: Speech normal.        Behavior: Behavior normal.     ED Results / Procedures / Treatments   Labs (all labs ordered are listed, but only abnormal results are displayed) Labs Reviewed - No data to display  EKG None  Radiology No results found.  Procedures Procedures    Medications Ordered in ED Medications - No data to display  ED Course/ Medical Decision Making/ A&P                           Medical Decision Making Amount and/or Complexity of Data Reviewed Radiology: ordered.   Patient with known COVID-positive test prior to arrival.  Chest x-ray per my interpretation shows she has signs of pneumonia.  Likely viral in etiology.  Will start on empiric antibiotics as this is bacterial.  Patient does not appear to be septic at this time.  She is afebrile.  Her O2 sat is stable.  She is not tachypneic.  Has baseline dementia and did require restraints as she was getting out of bed.  Will discharge back to facility        Final Clinical Impression(s) / ED Diagnoses Final diagnoses:  None    Rx / DC Orders ED Discharge Orders     None         Lorre Nick, MD 02/28/22 1445

## 2022-02-28 NOTE — ED Triage Notes (Signed)
Pt from [facility] memory care unit. Pt was reportedly diagnosed with COVID today and staff stated that she seemed more SOB. Pt in NAD during triage. Pt oriented to person.   EMS last VS - 132/56, HR 72, 95% on RA, RR 16.

## 2022-02-28 NOTE — ED Notes (Signed)
[facility] called  

## 2022-04-25 ENCOUNTER — Emergency Department (HOSPITAL_COMMUNITY): Payer: Medicare HMO

## 2022-04-25 ENCOUNTER — Other Ambulatory Visit: Payer: Self-pay

## 2022-04-25 ENCOUNTER — Emergency Department (HOSPITAL_COMMUNITY)
Admission: EM | Admit: 2022-04-25 | Discharge: 2022-04-25 | Disposition: A | Discharge status: Skilled Nursing Facility | Payer: Medicare HMO | Attending: Emergency Medicine | Admitting: Emergency Medicine

## 2022-04-25 ENCOUNTER — Encounter (HOSPITAL_COMMUNITY): Payer: Self-pay

## 2022-04-25 DIAGNOSIS — E039 Hypothyroidism, unspecified: Secondary | ICD-10-CM | POA: Diagnosis not present

## 2022-04-25 DIAGNOSIS — R600 Localized edema: Secondary | ICD-10-CM | POA: Diagnosis not present

## 2022-04-25 DIAGNOSIS — Z79899 Other long term (current) drug therapy: Secondary | ICD-10-CM | POA: Insufficient documentation

## 2022-04-25 DIAGNOSIS — R410 Disorientation, unspecified: Secondary | ICD-10-CM | POA: Diagnosis not present

## 2022-04-25 DIAGNOSIS — R519 Headache, unspecified: Secondary | ICD-10-CM | POA: Insufficient documentation

## 2022-04-25 DIAGNOSIS — W01198A Fall on same level from slipping, tripping and stumbling with subsequent striking against other object, initial encounter: Secondary | ICD-10-CM | POA: Diagnosis not present

## 2022-04-25 DIAGNOSIS — W19XXXA Unspecified fall, initial encounter: Secondary | ICD-10-CM

## 2022-04-25 MED ORDER — ACETAMINOPHEN 325 MG PO TABS
650.0000 mg | ORAL_TABLET | Freq: Once | ORAL | Status: AC
  Administered 2022-04-25: 650 mg via ORAL
  Filled 2022-04-25: qty 2

## 2022-04-25 NOTE — ED Notes (Signed)
[facility] called to req transport back to [facility]

## 2022-04-25 NOTE — Discharge Instructions (Signed)
Take Tylenol as needed for soreness.  Return to the emergency department for any new or worsening symptoms of concern.

## 2022-04-25 NOTE — ED Notes (Signed)
Patient transported to CT 

## 2022-04-25 NOTE — ED Notes (Signed)
Patient continues to get out of bed and tries to ambulate on her own, staff has redirected her back to her room multiple times. Non-slip sock put on to prevent fall or injury.

## 2022-04-25 NOTE — ED Triage Notes (Signed)
Pt BIB EMS from [facility], per staff pt fell straight down and migh have hit her head. Pt denies pain hx dementia
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
## 2022-11-06 ENCOUNTER — Emergency Department (HOSPITAL_BASED_OUTPATIENT_CLINIC_OR_DEPARTMENT_OTHER): Payer: Medicare HMO

## 2022-11-06 ENCOUNTER — Inpatient Hospital Stay (HOSPITAL_BASED_OUTPATIENT_CLINIC_OR_DEPARTMENT_OTHER)
Admission: EM | Admit: 2022-11-06 | Discharge: 2022-11-10 | DRG: 177 | Disposition: A | Discharge status: Skilled Nursing Facility | Payer: Medicare HMO | Attending: Family Medicine | Admitting: Family Medicine

## 2022-11-06 ENCOUNTER — Encounter (HOSPITAL_BASED_OUTPATIENT_CLINIC_OR_DEPARTMENT_OTHER): Payer: Self-pay

## 2022-11-06 ENCOUNTER — Other Ambulatory Visit (HOSPITAL_BASED_OUTPATIENT_CLINIC_OR_DEPARTMENT_OTHER): Payer: Self-pay

## 2022-11-06 DIAGNOSIS — E785 Hyperlipidemia, unspecified: Secondary | ICD-10-CM | POA: Diagnosis present

## 2022-11-06 DIAGNOSIS — R5381 Other malaise: Secondary | ICD-10-CM | POA: Diagnosis present

## 2022-11-06 DIAGNOSIS — J690 Pneumonitis due to inhalation of food and vomit: Principal | ICD-10-CM | POA: Diagnosis present

## 2022-11-06 DIAGNOSIS — T17890A Other foreign object in other parts of respiratory tract causing asphyxiation, initial encounter: Secondary | ICD-10-CM | POA: Diagnosis present

## 2022-11-06 DIAGNOSIS — J9601 Acute respiratory failure with hypoxia: Secondary | ICD-10-CM | POA: Diagnosis present

## 2022-11-06 DIAGNOSIS — E039 Hypothyroidism, unspecified: Secondary | ICD-10-CM | POA: Diagnosis present

## 2022-11-06 DIAGNOSIS — G309 Alzheimer's disease, unspecified: Secondary | ICD-10-CM | POA: Diagnosis present

## 2022-11-06 DIAGNOSIS — Z79890 Hormone replacement therapy (postmenopausal): Secondary | ICD-10-CM

## 2022-11-06 DIAGNOSIS — Z1152 Encounter for screening for COVID-19: Secondary | ICD-10-CM

## 2022-11-06 DIAGNOSIS — Z66 Do not resuscitate: Secondary | ICD-10-CM | POA: Diagnosis present

## 2022-11-06 DIAGNOSIS — F05 Delirium due to known physiological condition: Secondary | ICD-10-CM | POA: Diagnosis present

## 2022-11-06 DIAGNOSIS — Z79899 Other long term (current) drug therapy: Secondary | ICD-10-CM

## 2022-11-06 DIAGNOSIS — R531 Weakness: Principal | ICD-10-CM

## 2022-11-06 DIAGNOSIS — F02818 Dementia in other diseases classified elsewhere, unspecified severity, with other behavioral disturbance: Secondary | ICD-10-CM | POA: Diagnosis present

## 2022-11-06 DIAGNOSIS — X58XXXA Exposure to other specified factors, initial encounter: Secondary | ICD-10-CM | POA: Diagnosis present

## 2022-11-06 DIAGNOSIS — Z87891 Personal history of nicotine dependence: Secondary | ICD-10-CM

## 2022-11-06 LAB — LACTIC ACID, PLASMA
Lactic Acid, Venous: 2.4 mmol/L (ref 0.5–1.9)
Lactic Acid, Venous: 0.9 mmol/L (ref 0.5–1.9)

## 2022-11-06 LAB — CBC WITH DIFFERENTIAL/PLATELET
Abs Immature Granulocytes: 0.03 10*3/uL (ref 0.00–0.07)
Basophils Absolute: 0 10*3/uL (ref 0.0–0.1)
Basophils Relative: 0 %
Eosinophils Absolute: 0 10*3/uL (ref 0.0–0.5)
Eosinophils Relative: 0 %
HCT: 43.7 % (ref 36.0–46.0)
Hemoglobin: 14.2 g/dL (ref 12.0–15.0)
Immature Granulocytes: 0 %
Lymphocytes Relative: 15 %
Lymphs Abs: 1.4 10*3/uL (ref 0.7–4.0)
MCH: 30.5 pg (ref 26.0–34.0)
MCHC: 32.5 g/dL (ref 30.0–36.0)
MCV: 93.8 fL (ref 80.0–100.0)
Monocytes Absolute: 0.8 10*3/uL (ref 0.1–1.0)
Monocytes Relative: 9 %
Neutro Abs: 7.3 10*3/uL (ref 1.7–7.7)
Neutrophils Relative %: 76 %
Platelets: 211 10*3/uL (ref 150–400)
RBC: 4.66 MIL/uL (ref 3.87–5.11)
RDW: 13.2 % (ref 11.5–15.5)
WBC: 9.6 10*3/uL (ref 4.0–10.5)
nRBC: 0 % (ref 0.0–0.2)

## 2022-11-06 LAB — COMPREHENSIVE METABOLIC PANEL
ALT: 18 U/L (ref 0–44)
AST: 27 U/L (ref 15–41)
Albumin: 3.9 g/dL (ref 3.5–5.0)
Alkaline Phosphatase: 41 U/L (ref 38–126)
Anion gap: 14 (ref 5–15)
BUN: 26 mg/dL — ABNORMAL HIGH (ref 8–23)
CO2: 25 mmol/L (ref 22–32)
Calcium: 8.5 mg/dL — ABNORMAL LOW (ref 8.9–10.3)
Chloride: 103 mmol/L (ref 98–111)
Creatinine, Ser: 0.73 mg/dL (ref 0.44–1.00)
GFR, Estimated: >60 mL/min (ref >60)
Glucose, Bld: 117 mg/dL — ABNORMAL HIGH (ref 70–99)
Potassium: 3.8 mmol/L (ref 3.5–5.1)
Sodium: 142 mmol/L (ref 135–145)
Total Bilirubin: 0.7 mg/dL (ref 0.3–1.2)
Total Protein: 6.4 g/dL — ABNORMAL LOW (ref 6.5–8.1)

## 2022-11-06 LAB — RESP PANEL BY RT-PCR (RSV, FLU A&B, COVID)  RVPGX2
Influenza A by PCR: NEGATIVE
Influenza B by PCR: NEGATIVE
Resp Syncytial Virus by PCR: NEGATIVE
SARS Coronavirus 2 by RT PCR: NEGATIVE

## 2022-11-06 LAB — TROPONIN I (HIGH SENSITIVITY)
Troponin I (High Sensitivity): 9 ng/L (ref <18)
Troponin I (High Sensitivity): 10 ng/L (ref <18)

## 2022-11-06 LAB — LIPASE, BLOOD: Lipase: 24 U/L (ref 11–51)

## 2022-11-06 LAB — BRAIN NATRIURETIC PEPTIDE: B Natriuretic Peptide: 27.5 pg/mL (ref 0.0–100.0)

## 2022-11-06 MED ORDER — SODIUM CHLORIDE 0.9 % IV BOLUS (SEPSIS)
1000.0000 mL | Freq: Once | INTRAVENOUS | Status: AC
  Administered 2022-11-06: 1000 mL via INTRAVENOUS

## 2022-11-06 MED ORDER — GUAIFENESIN 100 MG/5ML PO LIQD: 600.0000 mg | Freq: Two times a day (BID) | ORAL | Status: DC

## 2022-11-06 MED ORDER — SODIUM CHLORIDE 0.9 % IV SOLN
3.0000 g | Freq: Once | INTRAVENOUS | Status: AC
  Administered 2022-11-06: 3 g via INTRAVENOUS

## 2022-11-06 MED ORDER — ALBUTEROL SULFATE (2.5 MG/3ML) 0.083% IN NEBU: 2.5000 mg | INHALATION_SOLUTION | Freq: Once | RESPIRATORY_TRACT | Status: DC

## 2022-11-06 MED ORDER — IOHEXOL 350 MG/ML SOLN
100.0000 mL | Freq: Once | INTRAVENOUS | Status: AC | PRN
  Administered 2022-11-06: 75 mL via INTRAVENOUS

## 2022-11-06 NOTE — ED Notes (Signed)
Critical LA 2.4

## 2022-11-06 NOTE — ED Provider Notes (Signed)
[facility] AT [facility] Provider Note   CSN: [id number] Arrival date & time: 11/06/22  1412     History Chief Complaint  Patient presents with   Weakness    Anne Wiggins is a 77 y.o. female.  Past history significant for Alzheimer disease presents emergency room with complaints of weakness.  Patient's daughter presents with her concern for worsening weakness and some tremulousness noted earlier today. Patient was started on Augmentin 3 days ago for concerns of bronchitis seen on CXR. No significant improvement in symptoms. Daughter states that patient typically begins to act altered when she has some type of infection present, typically a UTI. Patient unable to verbalize to me if she is experiencing any urinary symptoms, fever, abdominal pain, or other acute abnormalities from baseline.   Weakness      Home Medications Prior to Admission medications   Medication Sig Start Date End Date Taking? Authorizing Provider  amoxicillin-clavulanate (AUGMENTIN) 875-125 MG tablet Take 1 tablet by mouth 2 (two) times daily. 11/04/22  Yes [provider]  ergocalciferol (VITAMIN D2) 1.25 MG (50000 UT) capsule Take 50,000 Units by mouth once a week.   Yes [provider]  ezetimibe (ZETIA) 10 MG tablet Take 10 mg by mouth daily. 10/18/22  Yes [provider]  guaiFENesin 200 MG tablet Take 200 mg by mouth every 4 (four) hours as needed for cough or to loosen phlegm.   Yes [provider]  levothyroxine (SYNTHROID) 175 MCG tablet Take 175 mcg by mouth daily before breakfast.   Yes [provider]  melatonin 5 MG TABS Take 5 mg by mouth at bedtime.   Yes [provider]  memantine (NAMENDA) 10 MG tablet Take 10 mg by mouth 2 (two) times daily. 04/28/21  Yes [provider]  senna-docusate (SENOKOT-S) 8.6-50 MG tablet Take 2 tablets by mouth 2 (two) times daily.   Yes [provider]  sertraline  (ZOLOFT) 20 MG/ML concentrated solution Take 160 mg by mouth daily.   Yes [provider]  valproic acid (DEPAKENE) 250 MG/5ML solution Take 250 mg by mouth 3 (three) times daily. 10/02/22  Yes [provider]  acetaminophen (TYLENOL) 325 MG tablet Take 650 mg by mouth every 6 (six) hours as needed for moderate pain. Patient not taking: Reported on 11/08/2022    [provider]  buPROPion (WELLBUTRIN XL) 300 MG 24 hr tablet Take 300 mg by mouth daily. Patient not taking: Reported on 11/08/2022 04/28/21   [provider]  doxycycline (VIBRAMYCIN) 100 MG capsule Take 1 capsule (100 mg total) by mouth 2 (two) times daily. Patient not taking: Reported on 11/08/2022 02/28/22   Lorre Nick, MD  levothyroxine (SYNTHROID, LEVOTHROID) 75 MCG tablet Take 1 tablet (75 mcg total) by mouth daily. Patient not taking: Reported on 05/06/2021 07/18/11   Gordy Savers, MD  QUEtiapine (SEROQUEL) 50 MG tablet Take 50 mg by mouth 2 (two) times daily. Patient not taking: Reported on 11/08/2022    [provider]  sertraline (ZOLOFT) 100 MG tablet Take 150 mg by mouth daily. Patient not taking: Reported on 11/08/2022 04/28/21   [provider]  vitamin C (ASCORBIC ACID) 500 MG tablet Take 500 mg by mouth daily. Patient not taking: Reported on 11/08/2022    [provider]  Vitamin D, Cholecalciferol, 25 MCG (1000 UT) CAPS Take 1 capsule by mouth daily. Patient not taking: Reported on 11/08/2022    [provider]  Allergies    Patient has no known allergies.    Review of Systems   Review of Systems  Neurological:  Positive for weakness.  All other systems reviewed and are negative.   Physical Exam Updated Vital Signs BP (!) 112/46 (BP Location: Left Arm)   Pulse 75   Temp 98.4 F (36.9 C) (Oral)   Resp 16   Ht 5\' 6"  (1.676 m)   Wt 64 kg   SpO2 91%   BMI 22.77 kg/m  Physical Exam Vitals and nursing note reviewed.   Constitutional:      General: She is not in acute distress.    Appearance: She is well-developed.  HENT:     Head: Normocephalic and atraumatic.  Eyes:     Conjunctiva/sclera: Conjunctivae normal.  Cardiovascular:     Rate and Rhythm: Normal rate and regular rhythm.     Heart sounds: No murmur heard. Pulmonary:     Effort: Pulmonary effort is normal. No respiratory distress.     Comments: Difficult to assess respiratory sounds as patient would not take deep breaths when asked. Abdominal:     Palpations: Abdomen is soft.     Tenderness: There is no abdominal tenderness.  Musculoskeletal:        General: No swelling.     Cervical back: Neck supple.     Right lower leg: No edema.     Left lower leg: No edema.  Skin:    General: Skin is warm and dry.     Capillary Refill: Capillary refill takes less than 2 seconds.     Findings: No bruising, erythema or rash.  Neurological:     Mental Status: She is alert. She is disoriented.     Motor: Weakness present.     Comments: Disoriented at baseline due to Alzheimer's.  Psychiatric:        Mood and Affect: Mood normal.     ED Results / Procedures / Treatments   Labs (all labs ordered are listed, but only abnormal results are displayed) Labs Reviewed  URINE CULTURE - Abnormal; Notable for the following components:      Result Value   Culture   (*)    Value: <10,000 COLONIES/mL INSIGNIFICANT GROWTH Performed at [facility], [address], [place], [place] [address]    All other components within normal limits  COMPREHENSIVE METABOLIC PANEL - Abnormal; Notable for the following components:   Glucose, Bld 117 (*)    BUN 26 (*)    Calcium 8.5 (*)    Total Protein 6.4 (*)    All other components within normal limits  URINALYSIS, ROUTINE W REFLEX MICROSCOPIC - Abnormal; Notable for the following components:   Color, Urine AMBER (*)    Specific Gravity, Urine 1.032 (*)    Hgb urine dipstick MODERATE (*)    Ketones, ur 5  (*)    Nitrite POSITIVE (*)    Leukocytes,Ua MODERATE (*)    Bacteria, UA RARE (*)    All other components within normal limits  LACTIC ACID, PLASMA - Abnormal; Notable for the following components:   Lactic Acid, Venous 2.4 (*)    All other components within normal limits  BASIC METABOLIC PANEL - Abnormal; Notable for the following components:   Potassium 3.3 (*)    Glucose, Bld 109 (*)    Calcium 8.1 (*)    All other components within normal limits  BASIC METABOLIC PANEL - Abnormal; Notable for the following components:   Potassium  3.3 (*)    Glucose, Bld 105 (*)    Calcium 7.9 (*)    All other components within normal limits  RESP PANEL BY RT-PCR (RSV, FLU A&B, COVID)  RVPGX2  CULTURE, BLOOD (ROUTINE X 2)  CULTURE, BLOOD (ROUTINE X 2)  CBC WITH DIFFERENTIAL/PLATELET  LIPASE, BLOOD  LACTIC ACID, PLASMA  BRAIN NATRIURETIC PEPTIDE  PROCALCITONIN  CBC  TSH  TROPONIN I (HIGH SENSITIVITY)  TROPONIN I (HIGH SENSITIVITY)    EKG EKG Interpretation  Date/Time:  Monday Nov 06 2022 14:48:33 EDT Ventricular Rate:  84 PR Interval:  139 QRS Duration: 101 QT Interval:  383 QTC Calculation: 453 R Axis:   76 Text Interpretation: Sinus rhythm Ventricular premature complex Borderline ST depression, diffuse leads Confirmed by Benjiman Core [phone]) on 11/06/2022 2:51:36 PM  Radiology No results found.  Procedures Procedures   Medications Ordered in ED Medications  ampicillin-sulbactam (UNASYN) 1.5 g in sodium chloride 0.9 % 100 mL IVPB (1.5 g Intravenous New Bag/Given 11/09/22 1659)  albuterol (PROVENTIL) (2.5 MG/3ML) 0.083% nebulizer solution 2.5 mg (has no administration in time range)  lactated ringers infusion (0 mLs Intravenous Stopped 11/07/22 1308)  guaiFENesin (MUCINEX) 12 hr tablet 1,200 mg (1,200 mg Oral Given 11/09/22 0923)  acetaminophen (TYLENOL) tablet 650 mg (has no administration in time range)  ezetimibe (ZETIA) tablet 10 mg (10 mg Oral Given 11/09/22 0923)   melatonin tablet 5 mg (5 mg Oral Given 11/08/22 2122)  memantine (NAMENDA) tablet 10 mg (10 mg Oral Given 11/09/22 0924)  senna-docusate (Senokot-S) tablet 2 tablet (2 tablets Oral Given 11/09/22 0923)  sertraline (ZOLOFT) tablet 150 mg (150 mg Oral Given 11/09/22 0924)  levothyroxine (SYNTHROID) tablet 200 mcg (200 mcg Oral Given 11/09/22 0923)  valproic acid (DEPAKENE) 250 MG/5ML solution 125 mg (125 mg Oral Given 11/09/22 1659)  enoxaparin (LOVENOX) injection 40 mg (40 mg Subcutaneous Patient Refused/Not Given 11/09/22 0924)  pantoprazole (PROTONIX) EC tablet 40 mg (40 mg Oral Given 11/09/22 0924)  risperiDONE (RISPERDAL) 1 MG/ML oral solution 1 mg (1 mg Oral Given 11/09/22 0925)  iohexol (OMNIPAQUE) 350 MG/ML injection 100 mL (75 mLs Intravenous Contrast Given 11/06/22 1604)  sodium chloride 0.9 % bolus 1,000 mL (0 mLs Intravenous Stopped 11/06/22 1848)  Ampicillin-Sulbactam (UNASYN) 3 g in sodium chloride 0.9 % 100 mL IVPB (0 g Intravenous Stopped 11/06/22 1739)  haloperidol lactate (HALDOL) injection 2 mg (2 mg Intramuscular Given 11/07/22 1550)  potassium chloride SA (KLOR-CON M) CR tablet 20 mEq (20 mEq Oral Given 11/09/22 2130)    ED Course/ Medical Decision Making/ A&P                           Medical Decision Making Amount and/or Complexity of Data Reviewed Labs: ordered. Radiology: ordered.  Risk Prescription drug management. Decision regarding hospitalization.   This patient presents to the ED for concern of weakness.  Differential diagnosis includes generalized weakness, stroke, MI, viral URI   Lab Tests:  I Ordered, and personally interpreted labs.  The pertinent results include: CBC unremarkable, CMP with evidence of elevated BUN but a anion gap normal GFR normal, troponin normal, lactic acid normal, show viral panel negative, BNP normal, UA with signs of UTI, blood cultures drawn   Imaging Studies ordered:  I ordered imaging studies including chest x-ray, CT angio  chest I independently visualized and interpreted imaging which showed chronic bandlike density in the left lower lung, CT angio chest with signs consistent of bronchitis or  aspiration pneumonia I agree with the radiologist interpretation   Medicines ordered and prescription drug management:  I ordered medication including Unasyn, fluids for pneumonia Reevaluation of the patient after these medicines showed that the patient improved I have reviewed the patients home medicines and have made adjustments as needed   Problem List / ED Course:  Patient presents emergency department complaints of generalized weakness.  Patient is a resident at [facility] and her daughter is present with her in the room.  Reports that family noted patient began experience weakness and difficulty walking 2 days prior to arriving to emergency department.  Patient was given Augmentin after chest x-ray showed signs of bronchitis.  Family notes that patient has had some trembling in her hands today which is not typical.  Patient does have a past history of dementia.  Lab workup initiated which was largely unremarkable although there are some signs of possible UTI given findings of urinalysis.  Chest x-ray and CT angio chest were also ordered which did not reveal any acute abnormalities but there is possible concern for aspiration pneumonia.  Will treat patient with Unasyn and have patient admitted. Spoke with admitting hospitalist who agrees that patient would benefit from inpatient admission for symptomatic weakness likely secondary to aspiration pneumonia.  Informed patient and family of recommendations and they are agreeable with this treatment plan.  Final Clinical Impression(s) / ED Diagnoses Final diagnoses:  Weakness  Aspiration pneumonia, unspecified aspiration pneumonia type, unspecified laterality, unspecified part of lung [facility])    Rx / DC Orders ED Discharge Orders     None         Smitty Knudsen,  PA-C 11/09/22 2020    Mardene Sayer, MD 11/10/22 1001

## 2022-11-06 NOTE — ED Notes (Signed)
[facility] will send transport when available. They are currently behind and will be having shift change in the next hr. No eta on transport. Bed Ready at [facility] Room# 1518.-ABB(NS)

## 2022-11-06 NOTE — ED Notes (Signed)
Patients oxygen at 88% on room air. Patient's unable to tolerate nasal cannula (becomes increasingly agitated). Patients daughter at bedside.   PA, Zelaya at bedside updated and aware.

## 2022-11-06 NOTE — ED Notes (Signed)
ED Provider at bedside. 

## 2022-11-06 NOTE — ED Triage Notes (Signed)
Patient here POV from [place] [facility] with family.  On Friday, the patient noted Weakness and Difficulty Walking. Given Augmentin after CXR showed Bronchitis.   Today it was noted by Family that she was having Trembling to Bilateral hands.   No Discernable Cough. No Known Fevers.   NAD Noted during Triage. Dementia. BIB Wheelchair.

## 2022-11-07 DIAGNOSIS — R0602 Shortness of breath: Secondary | ICD-10-CM | POA: Diagnosis present

## 2022-11-07 DIAGNOSIS — E785 Hyperlipidemia, unspecified: Secondary | ICD-10-CM | POA: Diagnosis present

## 2022-11-07 DIAGNOSIS — E039 Hypothyroidism, unspecified: Secondary | ICD-10-CM | POA: Diagnosis present

## 2022-11-07 DIAGNOSIS — F05 Delirium due to known physiological condition: Secondary | ICD-10-CM | POA: Diagnosis present

## 2022-11-07 DIAGNOSIS — R531 Weakness: Secondary | ICD-10-CM

## 2022-11-07 DIAGNOSIS — J690 Pneumonitis due to inhalation of food and vomit: Secondary | ICD-10-CM | POA: Diagnosis not present

## 2022-11-07 DIAGNOSIS — T17890A Other foreign object in other parts of respiratory tract causing asphyxiation, initial encounter: Secondary | ICD-10-CM | POA: Diagnosis present

## 2022-11-07 DIAGNOSIS — Z79890 Hormone replacement therapy (postmenopausal): Secondary | ICD-10-CM | POA: Diagnosis not present

## 2022-11-07 DIAGNOSIS — R5381 Other malaise: Secondary | ICD-10-CM | POA: Diagnosis present

## 2022-11-07 DIAGNOSIS — R0902 Hypoxemia: Secondary | ICD-10-CM | POA: Diagnosis not present

## 2022-11-07 DIAGNOSIS — Z87891 Personal history of nicotine dependence: Secondary | ICD-10-CM | POA: Diagnosis not present

## 2022-11-07 DIAGNOSIS — G309 Alzheimer's disease, unspecified: Secondary | ICD-10-CM | POA: Diagnosis present

## 2022-11-07 DIAGNOSIS — Z79899 Other long term (current) drug therapy: Secondary | ICD-10-CM | POA: Diagnosis not present

## 2022-11-07 DIAGNOSIS — F02818 Dementia in other diseases classified elsewhere, unspecified severity, with other behavioral disturbance: Secondary | ICD-10-CM | POA: Diagnosis present

## 2022-11-07 DIAGNOSIS — Z66 Do not resuscitate: Secondary | ICD-10-CM | POA: Diagnosis present

## 2022-11-07 DIAGNOSIS — Z1152 Encounter for screening for COVID-19: Secondary | ICD-10-CM | POA: Diagnosis not present

## 2022-11-07 DIAGNOSIS — J9601 Acute respiratory failure with hypoxia: Secondary | ICD-10-CM | POA: Diagnosis present

## 2022-11-07 DIAGNOSIS — X58XXXA Exposure to other specified factors, initial encounter: Secondary | ICD-10-CM | POA: Diagnosis present

## 2022-11-07 LAB — URINALYSIS, ROUTINE W REFLEX MICROSCOPIC
Bilirubin Urine: NEGATIVE
Glucose, UA: NEGATIVE mg/dL
Ketones, ur: 5 mg/dL — AB
Nitrite: POSITIVE — AB
Protein, ur: NEGATIVE mg/dL
Specific Gravity, Urine: 1.032 — ABNORMAL HIGH (ref 1.005–1.030)
pH: 5 (ref 5.0–8.0)

## 2022-11-07 LAB — PROCALCITONIN: Procalcitonin: <0.1 ng/mL

## 2022-11-07 MED ORDER — MELATONIN 5 MG PO TABS
5.0000 mg | ORAL_TABLET | Freq: Every day | ORAL | Status: DC
  Administered 2022-11-07 – 2022-11-09 (×3): 5 mg via ORAL
  Filled 2022-11-07 (×3): qty 1

## 2022-11-07 MED ORDER — VITAMIN D 25 MCG (1000 UNIT) PO TABS
1000.0000 [IU] | ORAL_TABLET | Freq: Every day | ORAL | Status: DC
  Administered 2022-11-08: 1000 [IU] via ORAL
  Filled 2022-11-07 (×2): qty 1

## 2022-11-07 MED ORDER — VALPROIC ACID 250 MG/5ML PO SOLN
125.0000 mg | Freq: Three times a day (TID) | ORAL | Status: DC
  Administered 2022-11-07 – 2022-11-10 (×9): 125 mg via ORAL
  Filled 2022-11-07 (×10): qty 5

## 2022-11-07 MED ORDER — VITAMIN C 500 MG PO TABS
500.0000 mg | ORAL_TABLET | Freq: Every day | ORAL | Status: DC
  Administered 2022-11-08: 500 mg via ORAL
  Filled 2022-11-07 (×2): qty 1

## 2022-11-07 MED ORDER — HALOPERIDOL LACTATE 5 MG/ML IJ SOLN
2.0000 mg | Freq: Once | INTRAMUSCULAR | Status: AC
  Administered 2022-11-07: 2 mg via INTRAMUSCULAR

## 2022-11-07 MED ORDER — LEVOTHYROXINE SODIUM 100 MCG PO TABS
200.0000 ug | ORAL_TABLET | Freq: Every day | ORAL | Status: DC
  Administered 2022-11-08 – 2022-11-10 (×3): 200 ug via ORAL
  Filled 2022-11-07 (×3): qty 2

## 2022-11-07 MED ORDER — GUAIFENESIN ER 600 MG PO TB12
1200.0000 mg | ORAL_TABLET | Freq: Two times a day (BID) | ORAL | Status: DC
  Administered 2022-11-07 – 2022-11-10 (×6): 1200 mg via ORAL
  Filled 2022-11-07 (×6): qty 2

## 2022-11-07 MED ORDER — ACETAMINOPHEN 325 MG PO TABS
650.0000 mg | ORAL_TABLET | Freq: Four times a day (QID) | ORAL | Status: DC | PRN
  Filled 2022-11-07: qty 2

## 2022-11-07 MED ORDER — BUPROPION HCL ER (XL) 300 MG PO TB24: 300.0000 mg | ORAL_TABLET | Freq: Every day | ORAL | Status: DC

## 2022-11-07 MED ORDER — RISPERIDONE 1 MG/ML PO SOLN: 0.5000 mg | Freq: Two times a day (BID) | ORAL | Status: DC

## 2022-11-07 MED ORDER — ALBUTEROL SULFATE (2.5 MG/3ML) 0.083% IN NEBU: 2.5000 mg | INHALATION_SOLUTION | RESPIRATORY_TRACT | Status: DC | PRN

## 2022-11-07 MED ORDER — HALOPERIDOL LACTATE 5 MG/ML IJ SOLN
2.0000 mg | Freq: Once | INTRAMUSCULAR | Status: DC
  Filled 2022-11-07: qty 1

## 2022-11-07 MED ORDER — RISPERIDONE 1 MG/ML PO SOLN
0.5000 mg | Freq: Two times a day (BID) | ORAL | Status: DC
  Administered 2022-11-07 – 2022-11-08 (×2): 0.5 mg via ORAL
  Filled 2022-11-07 (×2): qty 0.5

## 2022-11-07 MED ORDER — QUETIAPINE FUMARATE 25 MG PO TABS: 50.0000 mg | ORAL_TABLET | Freq: Two times a day (BID) | ORAL | Status: DC

## 2022-11-07 MED ORDER — SENNOSIDES-DOCUSATE SODIUM 8.6-50 MG PO TABS
2.0000 | ORAL_TABLET | Freq: Two times a day (BID) | ORAL | Status: DC
  Administered 2022-11-07 – 2022-11-10 (×6): 2 via ORAL
  Filled 2022-11-07 (×6): qty 2

## 2022-11-07 MED ORDER — PANTOPRAZOLE SODIUM 40 MG PO TBEC
40.0000 mg | DELAYED_RELEASE_TABLET | Freq: Every day | ORAL | Status: DC
  Administered 2022-11-07 – 2022-11-10 (×4): 40 mg via ORAL
  Filled 2022-11-07 (×5): qty 1

## 2022-11-07 MED ORDER — EZETIMIBE 10 MG PO TABS
10.0000 mg | ORAL_TABLET | Freq: Every day | ORAL | Status: DC
  Administered 2022-11-07 – 2022-11-10 (×4): 10 mg via ORAL
  Filled 2022-11-07 (×4): qty 1

## 2022-11-07 MED ORDER — ENOXAPARIN SODIUM 40 MG/0.4ML IJ SOSY
40.0000 mg | PREFILLED_SYRINGE | INTRAMUSCULAR | Status: DC
  Administered 2022-11-10: 40 mg via SUBCUTANEOUS
  Filled 2022-11-07 (×2): qty 0.4

## 2022-11-07 MED ORDER — SODIUM CHLORIDE 0.9 % IV SOLN
1.5000 g | Freq: Four times a day (QID) | INTRAVENOUS | Status: DC
  Administered 2022-11-07 – 2022-11-10 (×14): 1.5 g via INTRAVENOUS
  Filled 2022-11-07 (×15): qty 4

## 2022-11-07 MED ORDER — SERTRALINE HCL 50 MG PO TABS
150.0000 mg | ORAL_TABLET | Freq: Every day | ORAL | Status: DC
  Administered 2022-11-08 – 2022-11-10 (×3): 150 mg via ORAL
  Filled 2022-11-07 (×3): qty 1

## 2022-11-07 MED ORDER — LEVOTHYROXINE SODIUM 25 MCG PO TABS: 175.0000 ug | ORAL_TABLET | Freq: Every day | ORAL | Status: DC

## 2022-11-07 MED ORDER — MEMANTINE HCL 10 MG PO TABS
10.0000 mg | ORAL_TABLET | Freq: Two times a day (BID) | ORAL | Status: DC
  Administered 2022-11-07 – 2022-11-10 (×6): 10 mg via ORAL
  Filled 2022-11-07 (×6): qty 1

## 2022-11-07 MED ORDER — LACTATED RINGERS IV SOLN: INTRAVENOUS | Status: AC

## 2022-11-07 NOTE — Progress Notes (Signed)
Pt with dementia. Alert to self only. Unable to complete admission documentation at this time. No family at the bedside.

## 2022-11-07 NOTE — ED Notes (Signed)
[facility] has already been called

## 2022-11-07 NOTE — Progress Notes (Signed)
Patient becomes agitated and combative while sitter and tech were doing patient care. Patient start biting and kicking staff and verbally  abusive. MD was notified, pt. Was placed on 4 point restraint. Pt. Does not appear to be in distress, on 2L nasal canula. PRN Haldol given IM for agitation, will monitor patient closely. Family was notified daughter is aware.

## 2022-11-07 NOTE — Evaluation (Signed)
Clinical/Bedside Swallow Evaluation Patient Details  Name: Anne Wiggins MRN: [id number] Date of Birth: 07-11-1945  Today's Date: 11/07/2022 Time: SLP Start Time (ACUTE ONLY): 1201 SLP Stop Time (ACUTE ONLY): 1250 SLP Time Calculation (min) (ACUTE ONLY): 49 min  Past Medical History:  Past Medical History:  Diagnosis Date   Allergy    Hyperlipidemia    Thyroid disease    UTI (urinary tract infection)    Past Surgical History: History reviewed. No pertinent surgical history. HPI:  Anne Wiggins is a 77 y.o. female with medical history significant of dementia, hypothyroidism.  She is normally in the memory care at [facility].    She presents with her concern for worsening weakness and some tremulousness.  Patient is normally ambulatory on own.   She recently was started on Augmentin 3 days ago for concerns of bronchitis. Since then she has had no significant improvement in symptoms. Daughter states that patient typically begins to act altered when she has some type of infection present, typically a UTI.     Pt has h/o smoking significantly.  She is also found to have UTI. Chest CT showed Mucous plugging or aspirated material is noted in left lower lobe  bronchi. Minimal left posterior basilar subsegmental atelectasis is  noted.    Swallow eval ordered.    [name], daughter, reports pt has been seen by SLP at the facility and she questioned if pt is to have a MBS.  Daughter denies pt having significant issues with swallowing nor GERD.    Assessment / Plan / Recommendation  Clinical Impression  Patient presents with functional oropharyngeal swallow based on clinical swallow evaluation. Her swallow appeared timely with adequate mastication and no indication of oropharyngeal retention.   Subtle cough/throat clearing noted after intake and at baseline.  Patient did not pass 3 ounce Yale water challenge due to requiring rest break and having subtle cough.   She is able to feed herself, has upper denture -  no lowers - Discussed in detail with daughter, [name], regarding inability to rule out silent aspiration and pt's baseline h/o smoking, secretions retained in left lung and report of post nasal drainage.  Pt would not particiipate in esophagram per conversation with daughter but may be willing to do MBS. Recommend initiate diet dys3/thin and SLP will follow up next date *late pm is goal* to determine readiness/indication for MBS. Daughter reports pt chews well - which she thinks is compensatory.   Advised to general aspiration/reflux precautions using teach back. Reached out to MD re care plan and discussed with pt/daughter, and RN. SLP Visit Diagnosis: Dysphagia, unspecified (R13.10)    Aspiration Risk  Mild aspiration risk    Diet Recommendation Dysphagia 3 (Mech soft);Thin liquid   Supervision: Full supervision/cueing for compensatory strategies Compensations: Slow rate;Small sips/bites Postural Changes: Seated upright at 90 degrees;Remain upright for at least 30 minutes after po intake    Other  Recommendations Oral Care Recommendations: Oral care BID    Recommendations for follow up therapy are one component of a multi-disciplinary discharge planning process, led by the attending physician.  Recommendations may be updated based on patient status, additional functional criteria and insurance authorization.  Follow up Recommendations Follow physician's recommendations for discharge plan and follow up therapies      Assistance Recommended at Discharge  TBD  Functional Status Assessment Patient has had a recent decline in their functional status and demonstrates the ability to make significant improvements in function in a reasonable and predictable amount of  time.  Frequency and Duration min 1 x/week  1 week       Prognosis Prognosis for improved oropharyngeal function: Good      Swallow Study   General Date of Onset: 11/07/22 HPI: Anne Wiggins is a 77 y.o. female with medical  history significant of dementia, hypothyroidism.  She is normally in the memory care at [facility].    She presents with her concern for worsening weakness and some tremulousness.  Patient is normally ambulatory on own.   She recently was started on Augmentin 3 days ago for concerns of bronchitis. Since then she has had no significant improvement in symptoms. Daughter states that patient typically begins to act altered when she has some type of infection present, typically a UTI.     Pt has h/o smoking significantly.  She is also found to have UTI. Chest CT showed Mucous plugging or aspirated material is noted in left lower lobe  bronchi. Minimal left posterior basilar subsegmental atelectasis is  noted.    Swallow eval ordered.    [name], daughter, reports pt has been seen by SLP at the facility and she questioned if pt is to have a MBS.  Daughter denies pt having significant issues with swallowing nor GERD. Type of Study: Bedside Swallow Evaluation Diet Prior to this Study: NPO Temperature Spikes Noted: No Respiratory Status: Room air History of Recent Intubation: No Behavior/Cognition: Alert;Cooperative;Pleasant mood Oral Cavity Assessment: Within Functional Limits Oral Care Completed by SLP: Yes Oral Cavity - Dentition: Dentures, top;Other (Comment) (lower dentures missing) Self-Feeding Abilities: Able to feed self Patient Positioning: Upright in bed Baseline Vocal Quality: Normal Volitional Cough: Strong Volitional Swallow: Unable to elicit    Oral/Motor/Sensory Function Overall Oral Motor/Sensory Function: Within functional limits   Ice Chips Ice chips: Not tested   Thin Liquid Thin Liquid: Impaired Presentation: Cup;Straw;Self Fed Pharyngeal  Phase Impairments: Cough - Delayed    Nectar Thick Nectar Thick Liquid: Within functional limits Presentation: Cup;Self Fed   Honey Thick Honey Thick Liquid: Not tested   Puree Puree: Within functional limits Presentation: Self Fed;Spoon    Solid     Solid: Impaired Presentation: Self Fed Pharyngeal Phase Impairments: Cough - Delayed      [name] 11/07/2022,1:10 PM   Rolena Infante, MS [facility] SLP Acute Rehab Services Office [phone]

## 2022-11-07 NOTE — TOC Initial Note (Addendum)
Transition of Care [facility]) - Initial/Assessment Note    Patient Details  Name: Anne Wiggins MRN: [id number] Date of Birth: [birth year]  Transition of Care [facility]) CM/SW Contact:    Otelia Santee, LCSW Phone Number: 11/07/2022, 12:09 PM  Clinical Narrative:                 Pt coming from [facility]. Voicemail left with pt's daughter, [name].  [facility] will continue to follow for discharge planning.   Update 1:30pm- Spoke with pt's daughter at bedside. Pt currently resides in memory care at [facility]. Pt has wheelchair however, ambulates independently. Pt has had home health services through [facility] in the past and has been to [facility] for SNF 1.5 years ago.   Expected Discharge Plan: Assisted Living Barriers to Discharge: Continued Medical Work up   Patient Goals and CMS Choice            Expected Discharge Plan and Services In-house Referral: NA Discharge Planning Services: NA Post Acute Care Choice: Resumption of Svcs/PTA Provider Living arrangements for the past 2 months: Assisted Living Facility ([facility])                                      Prior Living Arrangements/Services Living arrangements for the past 2 months: Assisted Living Facility ([facility]) Lives with:: Facility Resident Patient language and need for interpreter reviewed:: Yes Do you feel safe going back to the place where you live?: Yes      Need for Family Participation in Patient Care: Yes (Comment) Care giver support system in place?: Yes (comment) Current home services: DME Criminal Activity/Legal Involvement Pertinent to Current Situation/Hospitalization: No - Comment as needed  Activities of Daily Living      Permission Sought/Granted                  Emotional Assessment       Orientation: : Oriented to Self Alcohol / Substance Use: Not Applicable Psych Involvement: No (comment)  Admission diagnosis:  Aspiration pneumonia (HCC)  [J69.0] Weakness [R53.1] Aspiration pneumonia, unspecified aspiration pneumonia type, unspecified laterality, unspecified part of lung (HCC) [J69.0] Patient Active Problem List   Diagnosis Date Noted   Aspiration pneumonia (HCC) 11/06/2022   Hypothyroidism 07/18/2011   Dyslipidemia 07/18/2011   PCP:  Pcp, No Pharmacy:   [id number] [place], [place] - [address] [place] [address] Phone: [phone] Fax: [phone]  [facility] [address] [place] [address] Phone: [phone] Fax: [phone]     Social Determinants of Health (SDOH) Social History: SDOH Screenings   Tobacco Use: High Risk (11/06/2022)   SDOH Interventions:     Readmission Risk Interventions    11/07/2022   12:08 PM  Readmission Risk Prevention Plan  Transportation Screening Complete  PCP or Specialist Appt within 5-7 Days Complete  Home Care Screening Complete  Medication Review (RN CM) Complete

## 2022-11-07 NOTE — Evaluation (Signed)
Physical Therapy Evaluation Patient Details Name: Anne Wiggins MRN: [id number] DOB: 04-06-1946 Today's Date: 11/07/2022  History of Present Illness  77 yo female admitted to hospital on 11/06/2022 from [facility] memory care due to progressive weakness, tremulousness and AMS. At time of presentation to ED pt O2 saturation in 80s on RA. Pt started on Augmentin 3 days ago with no change in S and S. Pt was found to have UTI and aspiration PNA, Chest CT 5/20 revealed mucous plugging or aspirated material is noted in left lower lobe bronchi. Minimal left posterior basilar subsegmental atelectasis is noted. Pt PMH includes but is not limited to: allergies, HDL, thyroid dz, dementia, recurrent UTIs and tobacco abuse.  Clinical Impression    Pt admitted with above diagnosis.  Pt currently with functional limitations due to the deficits listed below (see PT Problem List). Pt in bed when PT arrived. NT/Sitter present and indicated daughter had recently left the hospital. Pt presents with a flat affect and then appears to be agitated when touched, pt exhibits perceived behaviors including swatting at PT or NT and cursing. Pt is able to intermittently follow one step commands and requires increased time for processing with minimal verbal response. Pt is a poor historian and unable to provide insight to PLOF at [facility] in [facility]. Pt required frequent cues and redirection for participation. Min guard for bed mobility, min A with HHA for STS from EOB, min guard with HHA for gait tasks 15 feet in room. Pt left seated in recliner, B UE mitts in place and NT/sitter present. No reports or evidence of pain behaviors or dizziness, S and S of SOB when pt on RA during gait tasks pt desaturated to 86% and [place] tube at 2 L/min replaced and pt recovered to 90% < 1 min. At rest 93% on 2 L/min at rest on RA 91%. Pt will benefit from acute skilled PT to increase their independence and safety with mobility to allow discharge.         Recommendations for follow up therapy are one component of a multi-disciplinary discharge planning process, led by the attending physician.  Recommendations may be updated based on patient status, additional functional criteria and insurance authorization.  Follow Up Recommendations       Assistance Recommended at Discharge Frequent or constant Supervision/Assistance  Patient can return home with the following  A little help with walking and/or transfers;A lot of help with bathing/dressing/bathroom;Assistance with cooking/housework;Direct supervision/assist for medications management;Assist for transportation;Direct supervision/assist for financial management;Help with stairs or ramp for entrance    Equipment Recommendations None recommended by PT  Recommendations for Other Services       Functional Status Assessment Patient has had a recent decline in their functional status and demonstrates the ability to make significant improvements in function in a reasonable and predictable amount of time.     Precautions / Restrictions Precautions Precautions: Fall Restrictions Weight Bearing Restrictions: No      Mobility  Bed Mobility Overal bed mobility: Needs Assistance Bed Mobility: Supine to Sit     Supine to sit: Min guard, HOB elevated     General bed mobility comments: cues for attention to task and increased time    Transfers Overall transfer level: Needs assistance Equipment used: 1 person hand held assist Transfers: Sit to/from Stand Sit to Stand: Min assist           General transfer comment: cues for STS and pt initiating to reach for therapist hand for assist  Ambulation/Gait Ambulation/Gait assistance: Min guard Gait Distance (Feet): 15 Feet Assistive device: 1 person hand held assist Gait Pattern/deviations: Shuffle, Trunk flexed Gait velocity: decreased     General Gait Details: pt difficult to redirect to sitting surface once in standing and  ambulating  Stairs            Wheelchair Mobility    Modified Rankin (Stroke Patients Only)       Balance Overall balance assessment: Needs assistance (unclear of fall hx) Sitting-balance support: Feet supported Sitting balance-Leahy Scale: Fair     Standing balance support: Single extremity supported Standing balance-Leahy Scale: Fair                               Pertinent Vitals/Pain Pain Assessment Pain Assessment: Faces Pain Score: 0-No pain    Home Living Family/patient expects to be discharged to:: Assisted living                 Home Equipment: Wheelchair - manual Additional Comments: pt currently residing at [facility] in memory care unit, pt amb without AD in unit.    Prior Function Prior Level of Function : Needs assist  Cognitive Assist : Mobility (cognitive);ADLs (cognitive) Mobility (Cognitive): Step by step cues ADLs (Cognitive): Step by step cues Physical Assist : ADLs (physical)   ADLs (physical): Grooming;Bathing;Dressing;Toileting;IADLs   ADLs Comments: pt is a poor historian and unable to provide insight to PLOF     Hand Dominance        Extremity/Trunk Assessment        Lower Extremity Assessment Lower Extremity Assessment: Generalized weakness    Cervical / Trunk Assessment Cervical / Trunk Assessment:  (wfl)  Communication   Communication: Expressive difficulties  Cognition Arousal/Alertness: Awake/alert Behavior During Therapy: Flat affect, Agitated, Restless, Impulsive Overall Cognitive Status: History of cognitive impairments - at baseline                                 General Comments: pt easily aggitated, does not like to be touched and will swat or grab as well as curse        General Comments      Exercises     Assessment/Plan    PT Assessment Patient needs continued PT services  PT Problem List Decreased strength;Decreased activity tolerance;Decreased balance;Decreased  mobility;Decreased coordination;Decreased cognition;Decreased knowledge of precautions;Decreased safety awareness;Cardiopulmonary status limiting activity       PT Treatment Interventions Gait training;Functional mobility training;Therapeutic activities;Therapeutic exercise;Balance training;Neuromuscular re-education;Cognitive remediation;Patient/family education    PT Goals (Current goals can be found in the Care Plan section)  Acute Rehab PT Goals Patient Stated Goal: unable    Frequency Min 1X/week     Co-evaluation               AM-PAC PT "6 Clicks" Mobility  Outcome Measure Help needed turning from your back to your side while in a flat bed without using bedrails?: A Little Help needed moving from lying on your back to sitting on the side of a flat bed without using bedrails?: A Little Help needed moving to and from a bed to a chair (including a wheelchair)?: A Little Help needed standing up from a chair using your arms (e.g., wheelchair or bedside chair)?: A Little Help needed to walk in hospital room?: A Little Help needed climbing 3-5 steps with a railing? : Total 6  Click Score: 16    End of Session Equipment Utilized During Treatment: Oxygen Activity Tolerance: Treatment limited secondary to agitation Patient left: in chair;with call bell/phone within reach;with nursing/sitter in room Nurse Communication: Mobility status PT Visit Diagnosis: Unsteadiness on feet (R26.81);Other abnormalities of gait and mobility (R26.89);Muscle weakness (generalized) (M62.81);Difficulty in walking, not elsewhere classified (R26.2)    Time: 1610-9604 PT Time Calculation (min) (ACUTE ONLY): 26 min   Charges:   PT Evaluation $PT Eval Low Complexity: 1 Low PT Treatments $Therapeutic Activity: 8-22 mins        Johnny Bridge, PT Acute Rehab   Jacqualyn Posey 11/07/2022, 3:24 PM

## 2022-11-07 NOTE — Plan of Care (Signed)

## 2022-11-07 NOTE — H&P (Signed)
History and Physical    Anne Wiggins ZOX:[id number] DOB: [birth year] DOA: 11/06/2022  I have briefly reviewed the patient's prior medical records in [facility]  PCP: Pcp, No  Patient coming from: [facility]  Chief Complaint: weakness, difficulty walking  HPI: Anne Wiggins is a 77 y.o. female with medical history significant of dementia, hypothyroidism.  She is normally in the memory care at [facility].    She presents with her concern for worsening weakness and some tremulousness.  Patient is normally ambulatory on own.   She recently was started on Augmentin 3 days ago for concerns of bronchitis. Since then she has had no significant improvement in symptoms. Daughter states that patient typically begins to act altered when she has some type of infection present, typically a UTI.   Patient unable to verbalize to me if she is experiencing any urinary symptoms, fever, abdominal pain, or other acute abnormalities from baseline.   In the ER, O2 saturations mid to high 80s on room air.  CTA chest negative for PE but shows features consistent with aspiration pneumonia/mucous plugging.  Patient tx to [facility] for IV abx and SLP evaluation.    Review of Systems: As per HPI otherwise 10 point review of systems negative.   Past Medical History:  Diagnosis Date   Allergy    Hyperlipidemia    Thyroid disease    UTI (urinary tract infection)     History reviewed. No pertinent surgical history.   reports that she has been smoking cigarettes. She has never used smokeless tobacco. She reports that she does not currently use alcohol. She reports that she does not currently use drugs.  No Known Allergies  History reviewed. No pertinent family history.  Prior to Admission medications   Medication Sig Start Date End Date Taking? Authorizing Provider  amoxicillin-clavulanate (AUGMENTIN) 875-125 MG tablet Take 1 tablet by mouth 2 (two) times daily. 11/04/22  Yes [provider]   ezetimibe (ZETIA) 10 MG tablet Take 10 mg by mouth daily. 10/18/22  Yes [provider]  acetaminophen (TYLENOL) 325 MG tablet Take 650 mg by mouth every 6 (six) hours as needed for moderate pain.    [provider]  buPROPion (WELLBUTRIN XL) 300 MG 24 hr tablet Take 300 mg by mouth daily. 04/28/21   [provider]  Calcium Carb-Cholecalciferol 500-10 MG-MCG CHEW Chew 1 tablet by mouth daily.    [provider]  doxycycline (VIBRAMYCIN) 100 MG capsule Take 1 capsule (100 mg total) by mouth 2 (two) times daily. 02/28/22   Lorre Nick, MD  levothyroxine (SYNTHROID) 175 MCG tablet Take 175 mcg by mouth daily before breakfast.    [provider]  levothyroxine (SYNTHROID, LEVOTHROID) 75 MCG tablet Take 1 tablet (75 mcg total) by mouth daily. Patient not taking: Reported on 05/06/2021 07/18/11   Gordy Savers, MD  melatonin 5 MG TABS Take 5 mg by mouth at bedtime.    [provider]  memantine (NAMENDA) 10 MG tablet Take 10 mg by mouth 2 (two) times daily. 04/28/21   [provider]  Multiple Vitamins-Minerals (CENTRUM SILVER 50+WOMEN) TABS Take 1 tablet by mouth daily.    [provider]  QUEtiapine (SEROQUEL) 50 MG tablet Take 50 mg by mouth 2 (two) times daily.    [provider]  senna-docusate (SENOKOT-S) 8.6-50 MG tablet Take 2 tablets by mouth 2 (two) times daily.    [provider]  sertraline (ZOLOFT) 100 MG tablet Take 150 mg by  mouth daily. 04/28/21   [provider]  vitamin C (ASCORBIC ACID) 500 MG tablet Take 500 mg by mouth daily.    [provider]  Vitamin D, Cholecalciferol, 25 MCG (1000 UT) CAPS Take 1 capsule by mouth daily.    [provider]    Physical Exam: Vitals:   11/07/22 0100 11/07/22 0225 11/07/22 0227 11/07/22 0647  BP: (!) 118/59  (!) 149/65 (!) 147/65  Pulse: (!) 56  64 67  Resp: 16     Temp:   97.6 F (36.4 C) 97.7 F (36.5 C)  TempSrc:    Oral Axillary  SpO2: 92% (!) 87% 95% 92%  Weight:      Height:          Constitutional: NAD, calm, comfortable Neck: normal, supple, no masses, no thyromegaly Respiratory: diminished Cardiovascular: Regular rate and rhythm, no murmurs / rubs / gallops. No extremity edema. 2+ pedal pulses.  Abdomen: no tenderness, no masses palpated. Bowel sounds positive.  Musculoskeletal: no clubbing / cyanosis. Normal muscle tone.  Skin: no rashes, lesions, ulcers. No induration Neurologic: wearing mittens, not following exam Psychiatric: confused  Labs on Admission: I have personally reviewed following labs and imaging studies  CBC: Recent Labs  Lab 11/06/22 1507  WBC 9.6  NEUTROABS 7.3  HGB 14.2  HCT 43.7  MCV 93.8  PLT 211   Basic Metabolic Panel: Recent Labs  Lab 11/06/22 1507  NA 142  K 3.8  CL 103  CO2 25  GLUCOSE 117*  BUN 26*  CREATININE 0.73  CALCIUM 8.5*   GFR: Estimated Creatinine Clearance: 54.3 mL/min (by C-G formula based on SCr of 0.73 mg/dL). Liver Function Tests: Recent Labs  Lab 11/06/22 1507  AST 27  ALT 18  ALKPHOS 41  BILITOT 0.7  PROT 6.4*  ALBUMIN 3.9   Recent Labs  Lab 11/06/22 1507  LIPASE 24   No results for input(s): "AMMONIA" in the last 168 hours. Coagulation Profile: No results for input(s): "INR", "PROTIME" in the last 168 hours. Cardiac Enzymes: No results for input(s): "CKTOTAL", "CKMB", "CKMBINDEX", "TROPONINI" in the last 168 hours. BNP (last 3 results) No results for input(s): "PROBNP" in the last 8760 hours. HbA1C: No results for input(s): "HGBA1C" in the last 72 hours. CBG: No results for input(s): "GLUCAP" in the last 168 hours. Lipid Profile: No results for input(s): "CHOL", "HDL", "LDLCALC", "TRIG", "CHOLHDL", "LDLDIRECT" in the last 72 hours. Thyroid Function Tests: No results for input(s): "TSH", "T4TOTAL", "FREET4", "T3FREE", "THYROIDAB" in the last 72 hours. Anemia Panel: No results for input(s):  "VITAMINB12", "FOLATE", "FERRITIN", "TIBC", "IRON", "RETICCTPCT" in the last 72 hours. Urine analysis:    Component Value Date/Time   COLORURINE YELLOW 05/06/2021 2019   APPEARANCEUR CLEAR 05/06/2021 2019   LABSPEC >1.046 (H) 05/06/2021 2019   PHURINE 6.0 05/06/2021 2019   GLUCOSEU NEGATIVE 05/06/2021 2019   HGBUR NEGATIVE 05/06/2021 2019   BILIRUBINUR NEGATIVE 05/06/2021 2019   KETONESUR NEGATIVE 05/06/2021 2019   PROTEINUR NEGATIVE 05/06/2021 2019   NITRITE NEGATIVE 05/06/2021 2019   LEUKOCYTESUR NEGATIVE 05/06/2021 2019     Radiological Exams on Admission: CT Angio Chest PE W and/or Wo Contrast  Result Date: 11/06/2022 CLINICAL DATA:  Weakness. EXAM: CT ANGIOGRAPHY CHEST WITH CONTRAST TECHNIQUE: Multidetector CT imaging of the chest was performed using the standard protocol during bolus administration of intravenous contrast. Multiplanar CT image reconstructions and MIPs were obtained to evaluate the vascular anatomy. RADIATION DOSE REDUCTION: This exam was performed according to the departmental  dose-optimization program which includes automated exposure control, adjustment of the mA and/or kV according to patient size and/or use of iterative reconstruction technique. CONTRAST:  75mL OMNIPAQUE IOHEXOL 350 MG/ML SOLN COMPARISON:  Radiograph of same day. FINDINGS: Cardiovascular: Satisfactory opacification of the pulmonary arteries to the segmental level. No evidence of pulmonary embolism. Normal heart size. No pericardial effusion. Mediastinum/Nodes: No enlarged mediastinal, hilar, or axillary lymph nodes. Thyroid gland, trachea, and esophagus demonstrate no significant findings. Lungs/Pleura: No pneumothorax or pleural effusion is noted. Emphysematous disease is noted bilaterally. Mild biapical scarring is noted. Mucous plugging or aspirated material is noted in left lower lobe bronchi. Minimal left posterior basilar subsegmental atelectasis is noted. Upper Abdomen: No acute abnormality.  Musculoskeletal: No acute osseous abnormality is noted. Old L1 and L2 compression fractures are noted. Review of the MIP images confirms the above findings. IMPRESSION: No definite evidence of pulmonary embolus. Mucous plugging or aspirated material is noted in left lower lobe bronchi. Minimal left posterior basilar subsegmental atelectasis is noted. Aortic Atherosclerosis (ICD10-I70.0) and Emphysema (ICD10-J43.9). Electronically Signed   By: Lupita Raider M.D.   On: 11/06/2022 16:24   DG Chest Portable 1 View  Result Date: 11/06/2022 CLINICAL DATA:  Shortness breath and weakness EXAM: PORTABLE CHEST 1 VIEW COMPARISON:  04/25/2022 FINDINGS: The patient's chin and facial tissues mildly obscure the medial lung apices. Atherosclerotic calcification of the aortic arch. Tapering of the peripheral pulmonary vasculature favors emphysema. Chronic mild bandlike density at the left lung base, probably from scarring. IMPRESSION: 1. Chronic bandlike density at the left lung base, probably from scarring. 2. Aortic Atherosclerosis (ICD10-I70.0) and Emphysema (ICD10-J43.9). Electronically Signed   By: Gaylyn Rong M.D.   On: 11/06/2022 15:21     Assessment/Plan Principal Problem:   Aspiration pneumonia (HCC)    Aspiration PNA -SLP eval-- NPO until then -IV abx -follow abx -procal negative so low threshold to d/c abx after SLP eval -mucinex -flutter valve  Dementia with behavioral disturbances -resume home meds  Hypothyroidism -resume home meds    DVT prophylaxis:   Code Status: dnr  Family Communication: called daughter Disposition Plan: from [place] Consults called: slp    Admission status: inpt   Joseph Art [facility]   How to contact the [facility] Attending or Consulting provider 7A - 7P or covering provider during after hours 7P -7A, for this patient?  Check the care team in [facility] and look for a) attending/consulting TRH provider listed and b) the [facility] team listed Log  into [email or web address] and use [facility] universal password to access. If you do not have the password, please contact the hospital operator. Locate the [facility] provider you are looking for under [facility] and page to a number that you can be directly reached. If you still have difficulty reaching the provider, please page the [facility] (Director on Call) for the Hospitalists listed on amion for assistance.  11/07/2022, 7:48 AM

## 2022-11-07 NOTE — Progress Notes (Signed)
Pt confused and unable to perform flutter at this time.

## 2022-11-08 DIAGNOSIS — J690 Pneumonitis due to inhalation of food and vomit: Secondary | ICD-10-CM

## 2022-11-08 LAB — BASIC METABOLIC PANEL
Anion gap: 9 (ref 5–15)
BUN: 16 mg/dL (ref 8–23)
CO2: 23 mmol/L (ref 22–32)
Calcium: 8.1 mg/dL — ABNORMAL LOW (ref 8.9–10.3)
Chloride: 109 mmol/L (ref 98–111)
Creatinine, Ser: 0.6 mg/dL (ref 0.44–1.00)
GFR, Estimated: >60 mL/min (ref >60)
Glucose, Bld: 109 mg/dL — ABNORMAL HIGH (ref 70–99)
Potassium: 3.3 mmol/L — ABNORMAL LOW (ref 3.5–5.1)
Sodium: 141 mmol/L (ref 135–145)

## 2022-11-08 LAB — CBC
HCT: 39.4 % (ref 36.0–46.0)
Hemoglobin: 12.7 g/dL (ref 12.0–15.0)
MCH: 30.8 pg (ref 26.0–34.0)
MCHC: 32.2 g/dL (ref 30.0–36.0)
MCV: 95.4 fL (ref 80.0–100.0)
Platelets: 201 10*3/uL (ref 150–400)
RBC: 4.13 MIL/uL (ref 3.87–5.11)
RDW: 12.8 % (ref 11.5–15.5)
WBC: 8.6 10*3/uL (ref 4.0–10.5)
nRBC: 0 % (ref 0.0–0.2)

## 2022-11-08 MED ORDER — RISPERIDONE 1 MG/ML PO SOLN
1.0000 mg | Freq: Two times a day (BID) | ORAL | Status: DC
  Administered 2022-11-08 – 2022-11-10 (×4): 1 mg via ORAL
  Filled 2022-11-08 (×4): qty 1

## 2022-11-08 NOTE — TOC Progression Note (Addendum)
Transition of Care [facility]) - Progression Note    Patient Details  Name: Anne Wiggins MRN: [id number] Date of Birth: [birth year]  [facility]) CM/SW Contact  Otelia Santee, LCSW Phone Number: 11/08/2022, 11:48 AM  Clinical Narrative:    CSW met with pt's daughter in hallway to discuss recommendation for SNF vs ALF w/ HH. Pt's daughter shares that she believes pt is more mobile than she seems and would prefer pt to return to [facility] w/ [facility]. Pt's daughter requesting PT to work with pt while she is present. PT notified.   Update 1:45pm- Spoke with daughter regarding pt returning to [place] w/ home health. Pt's daughter is agreeable to this. Spoke with [facility] who share they have a contract with [employer]. HHPT/OT/SLP arranged with [facility]. HH orders will need to be placed prior to discharge.    Expected Discharge Plan: Assisted Living Barriers to Discharge: Continued Medical Work up  Expected Discharge Plan and Services In-house Referral: NA Discharge Planning Services: NA Post Acute Care Choice: Resumption of Svcs/PTA Provider Living arrangements for the past 2 months: Assisted Living Facility ([facility])                                       Social Determinants of Health (SDOH) Interventions SDOH Screenings   Tobacco Use: High Risk (11/06/2022)    Readmission Risk Interventions    11/07/2022   12:08 PM  Readmission Risk Prevention Plan  Transportation Screening Complete  PCP or Specialist Appt within 5-7 Days Complete  Home Care Screening Complete  Medication Review (RN CM) Complete

## 2022-11-08 NOTE — Progress Notes (Signed)
[facility] PROGRESS NOTE  Anne Wiggins (DOB: April 30, 1946) ZOX:[id number] PCP: Pcp, No  Brief Narrative: Anne Wiggins is a 77 y.o. female with a history of dementia, hypothyroidism who presented to the ED on 11/06/2022 from memory care due to weakness, difficulty walking. She had been behaving like she has with UTIs in the past and had recently been started on augmentin for acute bronchitis. She was found to be slightly hypoxic in the ED. CTA chest showed no PE, but did show features consistent with aspiration and mucous plugging. Antibiotics have been continued. Hospitalization complicated by delirium.  Subjective: Confused, sitter present reports no agitation this morning, hasn't eaten. Denies complaints, but is very confused.  Objective: BP (!) 152/61 (BP Location: Right Arm)   Pulse 73   Temp 98 F (36.7 C) (Axillary)   Resp 18   Ht 5\' 6"  (1.676 m)   Wt 64 kg   SpO2 91%   BMI 22.77 kg/m   Gen: Elderly female in no acute distress Pulm: Clear, nonlabored  CV: RRR, no MRG or edema GI: Soft, NT, ND, +BS Neuro: Alert and interactive but entirely disoriented, MAE. Ext: Warm, dry. Skin: No new rashes, lesions or ulcers on visualized skin   Assessment & Plan: Acute hypoxic respiratory failure: Due to aspiration pneumonia and mucous plugging.  - Wean oxygen as tolerated. Check ambulatory pulse oximetry.  - Continue high dose guaifenesin, flutter valve, wouldn't tolerate CPT  - Continue unasyn pending culture results (NGTD) - SLP evaluation limited by cognitive deficits, dysphagia diet ordered pending MBSS.   Pyuria: 21-50 WBCs/HPF on clean catch with +bacteriuria. With AMS that limits ability to relate symptoms, suspect the patient could have recurrent UTI.  - Monitor urine culture. On unasyn as above  Acute delirium on chronic dementia with behavioral disturbance:  - Continue home medications including namenda, risperidone (will increase dose while admitted), sertraline,  valproic acid.  - Delirium precautions - Attempt to abbreviate hospitalization, institute delirium precautions, continue safety sitter.   Hypothyroidism:  - Continue synthroid at . Check TSH since dose is so high in 64kg 76yo female.   Debility: Due to acute illness.  - Will order HH PT/OT/SLP at discharge.  Tyrone Nine, MD [facility] [email or web address] 11/08/2022, 2:15 PM

## 2022-11-09 DIAGNOSIS — J690 Pneumonitis due to inhalation of food and vomit: Secondary | ICD-10-CM | POA: Diagnosis not present

## 2022-11-09 LAB — BASIC METABOLIC PANEL
Anion gap: 9 (ref 5–15)
BUN: 14 mg/dL (ref 8–23)
CO2: 25 mmol/L (ref 22–32)
Calcium: 7.9 mg/dL — ABNORMAL LOW (ref 8.9–10.3)
Chloride: 108 mmol/L (ref 98–111)
Creatinine, Ser: 0.67 mg/dL (ref 0.44–1.00)
GFR, Estimated: >60 mL/min (ref >60)
Glucose, Bld: 105 mg/dL — ABNORMAL HIGH (ref 70–99)
Potassium: 3.3 mmol/L — ABNORMAL LOW (ref 3.5–5.1)
Sodium: 142 mmol/L (ref 135–145)

## 2022-11-09 LAB — TSH: TSH: 1.95 u[IU]/mL (ref 0.350–4.500)

## 2022-11-09 LAB — URINE CULTURE: Culture: <10000 — AB

## 2022-11-09 MED ORDER — POTASSIUM CHLORIDE CRYS ER 20 MEQ PO TBCR
20.0000 meq | EXTENDED_RELEASE_TABLET | Freq: Once | ORAL | Status: AC
  Administered 2022-11-09: 20 meq via ORAL
  Filled 2022-11-09: qty 1

## 2022-11-09 NOTE — Progress Notes (Signed)
[facility] PROGRESS NOTE  Anne Wiggins (DOB: 05-30-1946) ZOX:[id number] PCP: Pcp, No  Brief Narrative: Anne Wiggins is a 77 y.o. female with a history of dementia, hypothyroidism who presented to the ED on 11/06/2022 from memory care due to weakness, difficulty walking. She had been behaving like she has with UTIs in the past and had recently been started on augmentin for acute bronchitis. She was found to be slightly hypoxic in the ED. CTA chest showed no PE, but did show features consistent with aspiration and mucous plugging. Antibiotics have been continued. Hospitalization complicated by delirium, and the patient is noted to be severely deconditioned requiring acute rehabilitation which is being pursued.  Subjective: No new complaints, remains confused and is not at her mental baseline per daughter. Very weak when getting up with PT this morning. Will require SNF rehabilitation.   Objective: BP (!) 149/64 (BP Location: Right Arm)   Pulse 73   Temp (!) 97.4 F (36.3 C) (Oral)   Resp 16   Ht 5\' 6"  (1.676 m)   Wt 64 kg   SpO2 95%   BMI 22.77 kg/m   Gen: Elderly female in no distress Pulm: Clear and nonlabored, diminished though this may be due to patient nonparticipation  CV: RRR, no MRG or edema GI: Soft, NT, ND, +BS  Neuro: Alert and not oriented, follows no commands. Ext: Warm, no deformities. Dry. Skin: No acute rashes, lesions or ulcers on visualized skin   Assessment & Plan: Acute hypoxic respiratory failure: Due to aspiration pneumonia and mucous plugging. PCT and WBC reassuring, remains afebrile.  - Wean oxygen as tolerated. Check ambulatory pulse oximetry.  - Continue high dose guaifenesin, flutter valve, wouldn't tolerate CPT. OOB as much as reasonable. - Continue unasyn pending culture results (NG3D) - SLP evaluation limited by cognitive deficits, dysphagia diet ordered pending MBSS. Continue aspiration precautions.  Pyuria: 21-50 WBCs/HPF on clean catch with  +bacteriuria. With AMS that limits ability to relate symptoms, suspect the patient could have recurrent UTI.  - Urine culture did not have significant growth. On unasyn as above regardless.   Acute delirium on chronic dementia with behavioral disturbance:  - Continue home medications including namenda, risperidone (increased dose while admitted), sertraline, valproic acid.  - Delirium precautions - Attempt to abbreviate hospitalization, institute delirium precautions   Hypothyroidism:  - Continue synthroid at . TSH wnl at 1.950. Per daughter, pt requires brand name synthroid.   Debility: Due to acute illness. Current level of dependence precludes discharge directly back to memory care. TOC consulted and will pursue placement.  Tyrone Nine, MD [facility] [email or web address] 11/09/2022, 12:24 PM

## 2022-11-09 NOTE — Progress Notes (Signed)
Speech Language Pathology Treatment: Dysphagia  Patient Details Name: Anne Wiggins MRN: [id number] DOB: 06/25/1945 Today's Date: 11/09/2022 Time: 4098-1191 SLP Time Calculation (min) (ACUTE ONLY): 16 min  Assessment / Plan / Recommendation Clinical Impression  Patient seen for dysphagia treatment to assess for po tolerance and indication for instrumental evaluation. Today pt alert, does not follow directions - including consuming liquid via cup - pt indicates she wants a lid on it - in broken language. She is impulsive and takes large sequential boluses - which is often followed by delayed cough. Congested cough noted at baseline as well - but more consistent today after liquid intake. When pt given very small amount of liquid in cup- her clinical indication of possible airway compromise was much improved, however this is tedious and no a functional expectation. Pt also may have secretions retained in pharynx - due to post nasal drainage and current illness, they may be mobilized with po intake.  Benefit of thicker liquid not clinically apparent today.   Recommend focus on pt having water with her meals due to least caustic if aspirated. SLP highly doubts pt would participate in MBS and would likely not change outcome. Pt's dementia also prevents her from participating in swallowing therapy to improve function - nor follow directions for compensations. Risks of thickened liquids likely would outweigh benefit (and would likely not prevent aspiration) in this pt who has frequent UTIs.   Recommend continue dys3/thin diet with mitigation strategies.    Daughter not present to educate but swallow precaution signs updated and SLP spoke with sitter and pt's RN, who are in agreement.  Thanks.    HPI HPI: Anne Wiggins is a 77 y.o. female with medical history significant of dementia, hypothyroidism.  She is normally in the memory care at [facility].    She presents with her concern for worsening weakness and  some tremulousness.  Patient is normally ambulatory on own.   She recently was started on Augmentin 3 days ago for concerns of bronchitis. Since then she has had no significant improvement in symptoms. Daughter states that patient typically begins to act altered when she has some type of infection present, typically a UTI.     Pt has h/o smoking significantly.  She is also found to have UTI. Chest CT showed Mucous plugging or aspirated material is noted in left lower lobe  bronchi. Minimal left posterior basilar subsegmental atelectasis is  noted.    Swallow eval ordered.    [name], daughter, reports pt has been seen by SLP at the facility and she questioned if pt is to have a MBS.  Daughter denies pt having significant issues with swallowing nor GERD.      SLP Plan  All goals met      Recommendations for follow up therapy are one component of a multi-disciplinary discharge planning process, led by the attending physician.  Recommendations may be updated based on patient status, additional functional criteria and insurance authorization.    Recommendations  Diet recommendations: Dysphagia 3 (mechanical soft);Thin liquid Liquids provided via: Cup;Straw Medication Administration: Crushed with puree Supervision: Patient able to self feed;Full supervision/cueing for compensatory strategies Compensations: Slow rate;Small sips/bites Postural Changes and/or Swallow Maneuvers: Upright 30-60 min after meal (have pt feed self)                  Oral care BID     Dysphagia, unspecified (R13.10)     All goals met     [name]  11/09/2022,  6:06 PM

## 2022-11-09 NOTE — NC FL2 (Signed)
[place] MEDICAID FL2 LEVEL OF CARE FORM     IDENTIFICATION  Patient Name: Anne Wiggins Birthdate: 05/09/1946 Sex: female Admission Date (Current Location): 11/06/2022  [facility] and [place] Number:  Producer, television/film/video and Address:  [facility],  [address], [place] [address]      Provider Number: [id number]  Attending Physician Name and Address:  Tyrone Nine, MD  Relative Name and Phone Number:  Glendon Axe [phone]    Current Level of Care: Hospital Recommended Level of Care: Skilled Nursing Facility Prior Approval Number:    Date Approved/Denied:   PASRR Number: [id number]  Discharge Plan: SNF    Current Diagnoses: Patient Active Problem List   Diagnosis Date Noted   Aspiration pneumonia (HCC) 11/06/2022   Hypothyroidism 07/18/2011   Dyslipidemia 07/18/2011    Orientation RESPIRATION BLADDER Height & Weight     Self  O2 Incontinent Weight: 141 lb 1.5 oz (64 kg) Height:  5\' 6"  (167.6 cm)  BEHAVIORAL SYMPTOMS/MOOD NEUROLOGICAL BOWEL NUTRITION STATUS      Incontinent Diet (See discharge summary)  AMBULATORY STATUS COMMUNICATION OF NEEDS Skin   Extensive Assist Verbally Normal                       Personal Care Assistance Level of Assistance  Bathing, Feeding, Dressing Bathing Assistance: Maximum assistance Feeding assistance: Limited assistance Dressing Assistance: Maximum assistance     Functional Limitations Info  Sight, Hearing, Speech Sight Info: Adequate Hearing Info: Adequate Speech Info: Adequate    SPECIAL CARE FACTORS FREQUENCY  PT (By licensed PT), OT (By licensed OT), Speech therapy     PT Frequency: 5x/wk OT Frequency: 5x/wk     Speech Therapy Frequency: 5x/wk      Contractures Contractures Info: Not present    Additional Factors Info  Code Status, Allergies, Psychotropic Code Status Info: DNR Allergies Info: No Known Allergies Psychotropic Info: See MAR         Current Medications  (11/09/2022):  This is the current hospital active medication list Current Facility-Administered Medications  Medication Dose Route Frequency Provider Last Rate Last Admin   acetaminophen (TYLENOL) tablet 650 mg  650 mg Oral Q6H PRN Benjamine Mola, Jessica U, DO       albuterol (PROVENTIL) (2.5 MG/3ML) 0.083% nebulizer solution 2.5 mg  2.5 mg Nebulization Q2H PRN Crosley, Debby, MD       ampicillin-sulbactam (UNASYN) 1.5 g in sodium chloride 0.9 % 100 mL IVPB  1.5 g Intravenous Q6H Crosley, Debby, MD 200 mL/hr at 11/09/22 0922 1.5 g at 11/09/22 0922   enoxaparin (LOVENOX) injection 40 mg  40 mg Subcutaneous Q24H Vann, Jessica U, DO       ezetimibe (ZETIA) tablet 10 mg  10 mg Oral Daily Vann, Jessica U, DO   10 mg at 11/09/22 8469   guaiFENesin (MUCINEX) 12 hr tablet 1,200 mg  1,200 mg Oral BID Marlin Canary U, DO   1,200 mg at 11/09/22 6295   levothyroxine (SYNTHROID) tablet 200 mcg  200 mcg Oral QAC breakfast Marlin Canary U, DO   200 mcg at 11/09/22 2841   melatonin tablet 5 mg  5 mg Oral QHS Vann, Jessica U, DO   5 mg at 11/08/22 2122   memantine (NAMENDA) tablet 10 mg  10 mg Oral BID Marlin Canary U, DO   10 mg at 11/09/22 0924   pantoprazole (PROTONIX) EC tablet 40 mg  40 mg Oral Daily Marlin Canary U, DO  40 mg at 11/09/22 0924   risperiDONE (RISPERDAL) 1 MG/ML oral solution 1 mg  1 mg Oral BID Tyrone Nine, MD   1 mg at 11/09/22 1610   senna-docusate (Senokot-S) tablet 2 tablet  2 tablet Oral BID Joseph Art, DO   2 tablet at 11/09/22 9604   sertraline (ZOLOFT) tablet 150 mg  150 mg Oral Daily Marlin Canary U, DO   150 mg at 11/09/22 5409   valproic acid (DEPAKENE) 250 MG/5ML solution 125 mg  125 mg Oral TID Marlin Canary U, DO   125 mg at 11/09/22 8119     Discharge Medications: Please see discharge summary for a list of discharge medications.  Relevant Imaging Results:  Relevant Lab Results:   Additional Information SSN: [id number]  Otelia Santee, LCSW

## 2022-11-09 NOTE — TOC Progression Note (Signed)
Transition of Care [facility]) - Progression Note    Patient Details  Name: Anne Wiggins MRN: [id number] Date of Birth: 07-25-1945  [facility]) CM/SW Contact  Otelia Santee, LCSW Phone Number: 11/09/2022, 2:15 PM  Clinical Narrative:    Spoke with pt's daughter and confirmed change of plans for SNF for pt vs returning to ALF. Referrals have been sent out for SNF placement and currently awaiting bed offers.    Expected Discharge Plan: Assisted Living Barriers to Discharge: Continued Medical Work up  Expected Discharge Plan and Services In-house Referral: NA Discharge Planning Services: NA Post Acute Care Choice: Resumption of Svcs/PTA Provider Living arrangements for the past 2 months: Assisted Living Facility ([facility])                                       Social Determinants of Health (SDOH) Interventions SDOH Screenings   Tobacco Use: High Risk (11/06/2022)    Readmission Risk Interventions    11/07/2022   12:08 PM  Readmission Risk Prevention Plan  Transportation Screening Complete  PCP or Specialist Appt within 5-7 Days Complete  Home Care Screening Complete  Medication Review (RN CM) Complete

## 2022-11-09 NOTE — Progress Notes (Signed)
Physical Therapy Treatment Patient Details Name: Anne Wiggins MRN: [id number] DOB: 06/09/1946 Today's Date: 11/09/2022   History of Present Illness 77 yo female admitted to hospital on 11/06/2022 from [facility] memory care due to progressive weakness, tremulousness and AMS. At time of presentation to ED pt O2 saturation in 80s on RA. Pt started on Augmentin 3 days ago with no change in S and S. Pt was found to have UTI and aspiration PNA, Chest CT 5/20 revealed mucous plugging or aspirated material is noted in left lower lobe bronchi. Minimal left posterior basilar subsegmental atelectasis is noted. Pt PMH includes but is not limited to: allergies, HDL, thyroid dz, dementia, recurrent UTIs and tobacco abuse.    PT Comments    Session with Daughter [name] General Comments: AxO x 1 good eye contact and following simple one step repeated commands.  Daughter present to asisst with session and mentality.  Pt appears "weary"/"tired" Pt on 3 lts nasal at 97%.  Trial RA decreased to 90% Assisted OOB was difficult.  General bed mobility comments: required increased asisst this session.  Used bed pad to complete scooting to EOB.  Sitting balance is poor.  Present with LEFT lean.  Unable to self coorect.  Appears weak. General transfer comment: required increased asisst this session of + 2 assist.  Daughter present to engage with pt to optimize performance.  Assisted from EOB to [facility] was difficult.  Required Max Asisst to complete 1/4 pivot turn and target BSC.  Pt unaware but did have a large BM.  + 2 asisst to rise and a third for hygiene due to weakness/instability. General Gait Details: pt required + 2 Max Assist to amb a limited distance 9 feet.  Pt appear weak and tired.  Third asisst following with recliner.  SATURATION QUALIFICATIONS: (This note is used to comply with regulatory documentation for home oxygen)  Patient Saturations on Room Air at Rest = 90%  Patient Saturations on Room Air while  Ambulating 9 feet= 88%  Patient Saturations on 2 Liters of oxygen while Ambulating = 92%  Please briefly explain why patient needs home oxygen:  Pt required 2 lts of oxygen due to increased dyspnea and audible upper airway crackles.    Pt positioned in recliner with multiple pillows to correct severe LEFT lean.  Reapplied 2 lts nasal.  Pt was amb Indep at her ALF using to wall rails.  Significant mobility decline.  Pt will need ST Rehab at SNF to address mobility and functional decline prior to safely returning.  Daughter agrees. Will consult/update LPT    Recommendations for follow up therapy are one component of a multi-disciplinary discharge planning process, led by the attending physician.  Recommendations may be updated based on patient status, additional functional criteria and insurance authorization.  Follow Up Recommendations  Can patient physically be transported by private vehicle: No    Assistance Recommended at Discharge Frequent or constant Supervision/Assistance  Patient can return home with the following A little help with walking and/or transfers;A lot of help with bathing/dressing/bathroom;Assistance with cooking/housework;Direct supervision/assist for medications management;Assist for transportation;Direct supervision/assist for financial management;Help with stairs or ramp for entrance   Equipment Recommendations  None recommended by PT    Recommendations for Other Services       Precautions / Restrictions Precautions Precautions: Fall Precaution Comments: Hx Alzheimers     Mobility  Bed Mobility Overal bed mobility: Needs Assistance Bed Mobility: Supine to Sit     Supine to sit: Max assist  General bed mobility comments: required increased asisst this session.  Used bed pad to complete scooting to EOB.  Sitting balance is poor.  Present with LEFT lean.  Unable to self coorect.  Appears weak.    Transfers Overall transfer level: Needs  assistance Equipment used: 2 person hand held assist Transfers: Sit to/from Stand Sit to Stand: Mod assist, Max assist, +2 physical assistance, +2 safety/equipment           General transfer comment: required increased asisst this session of + 2 assist.  Daughter present to engage with pt to optimize performance.  Assisted from EOB to [facility] was difficult.  Required Max Asisst to complete 1/4 pivot turn and target BSC.  Pt unaware but did have a large BM.  + 2 asisst to rise and a third for hygiene due to weakness/instability.    Ambulation/Gait Ambulation/Gait assistance: Max assist, +2 physical assistance, +2 safety/equipment Gait Distance (Feet): 9 Feet Assistive device: 2 person hand held assist Gait Pattern/deviations: Shuffle, Trunk flexed Gait velocity: decreased     General Gait Details: pt required + 2 Max Assist to amb a limited distance 9 feet.  Pt appear weak and tired.  Third asisst following with recliner.   Stairs             Wheelchair Mobility    Modified Rankin (Stroke Patients Only)       Balance                                            Cognition Arousal/Alertness: Awake/alert Behavior During Therapy: Flat affect, Agitated, Restless, Impulsive Overall Cognitive Status: Impaired/Different from baseline Area of Impairment: Memory, Safety/judgement, Awareness, Following commands, Problem solving, Orientation, Attention                   Current Attention Level: Selective   Following Commands: Follows one step commands inconsistently Safety/Judgement: Decreased awareness of safety, Decreased awareness of deficits     General Comments: AxO x 1 good eye contact and following simple one step repeated commands.  Daughter present to asisst with session and mentality.  Pt appears "weary"/"tired"        Exercises      General Comments        Pertinent Vitals/Pain Pain Assessment Pain Assessment: No/denies pain     Home Living                          Prior Function            PT Goals (current goals can now be found in the care plan section) Progress towards PT goals: Progressing toward goals    Frequency    Min 1X/week      PT Plan Current plan remains appropriate    Co-evaluation              AM-PAC PT "6 Clicks" Mobility   Outcome Measure  Help needed turning from your back to your side while in a flat bed without using bedrails?: A Lot Help needed moving from lying on your back to sitting on the side of a flat bed without using bedrails?: A Lot Help needed moving to and from a bed to a chair (including a wheelchair)?: A Lot Help needed standing up from a chair using your arms (e.g., wheelchair or bedside chair)?: A Lot  Help needed to walk in hospital room?: A Lot Help needed climbing 3-5 steps with a railing? : Total 6 Click Score: 11    End of Session Equipment Utilized During Treatment: Oxygen;Gait belt Activity Tolerance: Patient limited by fatigue Patient left: in chair;with call bell/phone within reach;with nursing/sitter in room   PT Visit Diagnosis: Unsteadiness on feet (R26.81);Other abnormalities of gait and mobility (R26.89);Muscle weakness (generalized) (M62.81);Difficulty in walking, not elsewhere classified (R26.2)     Time: 1020-1110 PT Time Calculation (min) (ACUTE ONLY): 50 min  Charges:  $Gait Training: 8-22 mins $Therapeutic Activity: 23-37 mins                     {Giavonna Pflum  PTA Acute  [employer] M-F          [phone]

## 2022-11-10 ENCOUNTER — Encounter (HOSPITAL_COMMUNITY): Payer: Self-pay

## 2022-11-10 ENCOUNTER — Other Ambulatory Visit: Payer: Self-pay

## 2022-11-10 ENCOUNTER — Emergency Department (HOSPITAL_COMMUNITY)
Admission: EM | Admit: 2022-11-10 | Discharge: 2022-11-11 | Disposition: A | Discharge status: Home / Self Care | Payer: Medicare HMO | Attending: Emergency Medicine | Admitting: Emergency Medicine

## 2022-11-10 DIAGNOSIS — R0602 Shortness of breath: Secondary | ICD-10-CM | POA: Diagnosis present

## 2022-11-10 DIAGNOSIS — J690 Pneumonitis due to inhalation of food and vomit: Secondary | ICD-10-CM | POA: Diagnosis not present

## 2022-11-10 DIAGNOSIS — E039 Hypothyroidism, unspecified: Secondary | ICD-10-CM | POA: Insufficient documentation

## 2022-11-10 DIAGNOSIS — Z79890 Hormone replacement therapy (postmenopausal): Secondary | ICD-10-CM | POA: Insufficient documentation

## 2022-11-10 DIAGNOSIS — R0902 Hypoxemia: Secondary | ICD-10-CM | POA: Insufficient documentation

## 2022-11-10 MED ORDER — MEMANTINE HCL 5 MG PO TABS
10.0000 mg | ORAL_TABLET | Freq: Two times a day (BID) | ORAL | Status: DC
  Administered 2022-11-10 – 2022-11-11 (×2): 10 mg via ORAL
  Filled 2022-11-10 (×2): qty 2

## 2022-11-10 MED ORDER — RISPERIDONE 0.5 MG PO TBDP
0.5000 mg | ORAL_TABLET | Freq: Two times a day (BID) | ORAL | Status: DC
  Administered 2022-11-10 – 2022-11-11 (×2): 0.5 mg via ORAL
  Filled 2022-11-10 (×2): qty 1

## 2022-11-10 MED ORDER — LEVOTHYROXINE SODIUM 100 MCG PO TABS: 200.0000 ug | ORAL_TABLET | Freq: Every day | ORAL | Status: DC

## 2022-11-10 MED ORDER — SERTRALINE HCL 20 MG/ML PO CONC
160.0000 mg | Freq: Every day | ORAL | Status: DC
  Administered 2022-11-11: 160 mg via ORAL
  Filled 2022-11-10 (×2): qty 8

## 2022-11-10 MED ORDER — EZETIMIBE 10 MG PO TABS
10.0000 mg | ORAL_TABLET | Freq: Every day | ORAL | Status: DC
  Administered 2022-11-11 (×2): 10 mg via ORAL
  Filled 2022-11-10 (×2): qty 1

## 2022-11-10 MED ORDER — LORATADINE 10 MG PO TABS
10.0000 mg | ORAL_TABLET | Freq: Every day | ORAL | Status: DC
  Administered 2022-11-11: 10 mg via ORAL
  Filled 2022-11-10: qty 1

## 2022-11-10 MED ORDER — LEVOTHYROXINE SODIUM 200 MCG PO TABS: 200.0000 ug | ORAL_TABLET | Freq: Every day | ORAL | Status: AC

## 2022-11-10 MED ORDER — ACETAMINOPHEN 325 MG PO TABS: 650.0000 mg | ORAL_TABLET | Freq: Four times a day (QID) | ORAL | Status: DC | PRN

## 2022-11-10 MED ORDER — MELATONIN 5 MG PO TABS
5.0000 mg | ORAL_TABLET | Freq: Every day | ORAL | Status: DC
  Administered 2022-11-10: 5 mg via ORAL
  Filled 2022-11-10: qty 1

## 2022-11-10 MED ORDER — FLUTICASONE PROPIONATE 50 MCG/ACT NA SUSP: 1.0000 | Freq: Every day | NASAL | Status: AC

## 2022-11-10 MED ORDER — LORATADINE 10 MG PO TABS: 10.0000 mg | ORAL_TABLET | Freq: Every day | ORAL | Status: AC

## 2022-11-10 MED ORDER — RISPERIDONE 1 MG/ML PO SOLN: 0.5000 mg | Freq: Two times a day (BID) | ORAL | Status: AC

## 2022-11-10 MED ORDER — LEVOTHYROXINE SODIUM 100 MCG PO TABS
200.0000 ug | ORAL_TABLET | Freq: Every day | ORAL | Status: DC
  Filled 2022-11-10: qty 2

## 2022-11-10 MED ORDER — FLUTICASONE PROPIONATE 50 MCG/ACT NA SUSP
1.0000 | Freq: Every day | NASAL | Status: DC
  Filled 2022-11-10: qty 16

## 2022-11-10 MED ORDER — LORATADINE 10 MG PO TABS
10.0000 mg | ORAL_TABLET | Freq: Every day | ORAL | Status: DC
  Administered 2022-11-10: 10 mg via ORAL
  Filled 2022-11-10: qty 1

## 2022-11-10 MED ORDER — VALPROIC ACID 250 MG/5ML PO SOLN
250.0000 mg | Freq: Three times a day (TID) | ORAL | Status: DC
  Administered 2022-11-10 – 2022-11-11 (×2): 250 mg via ORAL
  Filled 2022-11-10 (×3): qty 5

## 2022-11-10 MED ORDER — SENNOSIDES-DOCUSATE SODIUM 8.6-50 MG PO TABS
2.0000 | ORAL_TABLET | Freq: Two times a day (BID) | ORAL | Status: DC
  Administered 2022-11-10 – 2022-11-11 (×2): 2 via ORAL
  Filled 2022-11-10 (×2): qty 2

## 2022-11-10 MED ORDER — FLUTICASONE PROPIONATE 50 MCG/ACT NA SUSP
1.0000 | Freq: Every day | NASAL | Status: DC
  Administered 2022-11-10: 1 via NASAL
  Filled 2022-11-10: qty 16

## 2022-11-10 MED ORDER — GUAIFENESIN 200 MG PO TABS: 200.0000 mg | ORAL_TABLET | ORAL | Status: DC | PRN

## 2022-11-10 NOTE — Progress Notes (Addendum)
    Addend @ 8:07 pm  CSW has reached out to [facility]/ [facility] about the patient. [facility] has stated that the patient does not need o2 to return, however the patient will need to be stable through the night before the patient can return at this time this CSW has let the providers know of the patient's new status. [facility] has reported that if the patient remains stable through the night that she may return in the AM. TOC will follow up in the AM about the patient's DC back to [facility]   CSW has contacted [facility] at this time they have reported that they do not see an active order for this patient. [facility] after hours stated they would call this CSW with update.    CSW has reached out to after hours [facility]. At this time they stated they would have someone reach back out to this CSW with status on O2. TOC will keep providers updated on O2 delivering status.

## 2022-11-10 NOTE — ED Triage Notes (Signed)
Patient BIB [facility] from [facility]. Patient discharged 2 hours ago from [facility] after being admitted for pneumonia. Facility took patients vitals and her oxygen was 84% room air. Was placed on 6L [place] by EMS. Oxygen came up to 89% and patient was placed on nonrebreather.   EMS 20G left hand

## 2022-11-10 NOTE — ED Notes (Signed)
[facility] called to have patient transported [facility].

## 2022-11-10 NOTE — ED Notes (Signed)
Spoke with social work, issues with oxygen delivery and facility not wanting to accept patient back tonight so [facility] called and cancelled transport.

## 2022-11-10 NOTE — ED Notes (Signed)
Patient able to follow commands and take medications as ordered, see mar. Patient alert and oriented to self at this time, unable to state year or where she is. Reoriented patient to situation.

## 2022-11-10 NOTE — ED Notes (Addendum)
SATURATION QUALIFICATIONS: (This note is used to comply with regulatory documentation for home oxygen)  Patient Saturations on Room Air at Rest = 83-85%  Patient Saturations on Room Air while Ambulating = Non ambulatory  Patient Saturations on 5 Liters of oxygen while Sitting = 94-95%  Please briefly explain why patient needs home oxygen: Patient has c/o SOB and sats stay between 83-85% on room air

## 2022-11-10 NOTE — ED Notes (Signed)
RE: Check pulse oximetry while ambulating: This pt isn't able to follow commands. I went in to see if she could understand me and she would open her eyes but when I asked her to squeeze my hand, she was not able to follow that command.

## 2022-11-10 NOTE — ED Notes (Signed)
Patient's daughter given update at this time.

## 2022-11-10 NOTE — Discharge Summary (Signed)
Physician Discharge Summary   Patient: Anne Wiggins MRN: [id number] DOB: Nov 07, 1945  Admit date:     11/06/2022  Discharge date: 11/10/22  Discharge Physician: Tyrone Nine   PCP: Pcp, No   Recommendations at discharge:  Continue acute physical therapy rehabilitation prior to return to memory care.    Discharge Diagnoses: Principal Problem:   Aspiration pneumonia [facility])  Hospital Course: Anne Wiggins is a 77 y.o. female with a history of dementia, hypothyroidism who presented to the ED on 11/06/2022 from memory care due to weakness, difficulty walking. She had been behaving like she has with UTIs in the past and had recently been started on augmentin for acute bronchitis. She was found to be slightly hypoxic in the ED. CTA chest showed no PE, but did show features consistent with aspiration and mucous plugging. Antibiotics have been continued. Hospitalization complicated by delirium, and the patient is noted to be severely deconditioned requiring acute rehabilitation which is being pursued.   Assessment and Plan: Acute hypoxic respiratory failure: Due to aspiration pneumonia and mucous plugging. PCT and WBC reassuring, remains afebrile.  - Wean oxygen as tolerated. Check ambulatory pulse oximetry as pt tolerates. - Continue guaifenesin to assist with mucous clearance and flutter valve if pt follows commands more consistently after discharge. - Blood cultures NG4D on day of discharge. With no fever, leukocytosis, normal PCT, and having completed 7 days of antibiotics (between PTA augmentin and unasyn while admitted), we will consider the course completed, but recommend ongoing aspiration precautions and SLP evaluations/interventions.   Pyuria: 21-50 WBCs/HPF on clean catch with +bacteriuria. With AMS that limits ability to relate symptoms, suspect the patient could have recurrent UTI.  - Urine culture did not have significant growth. Completed a course of antibiotics as above regardless.    Acute delirium on chronic dementia with behavioral disturbance:  - Continue home medications including namenda, risperidone, sertraline, valproic acid.  - Delirium precautions - Attempt to abbreviate hospitalization    Hypothyroidism:  - Continue synthroid at per facility [facility]. TSH wnl at 1.950. Per daughter, pt requires brand name synthroid.    Debility: Due to acute illness. Current level of dependence precludes discharge directly back to memory care. TOC consulted and will pursue placement.  Consultants: None Procedures performed: None  Disposition: Skilled nursing facility Diet recommendation: Dysphagia 3 DISCHARGE MEDICATION: Allergies as of 11/10/2022   No Known Allergies      Medication List     STOP taking these medications    amoxicillin-clavulanate 875-125 MG tablet Commonly known as: AUGMENTIN   ascorbic acid 500 MG tablet Commonly known as: VITAMIN C   buPROPion 300 MG 24 hr tablet Commonly known as: WELLBUTRIN XL   doxycycline 100 MG capsule Commonly known as: VIBRAMYCIN   QUEtiapine 50 MG tablet Commonly known as: SEROQUEL   Vitamin D (Cholecalciferol) 25 MCG (1000 UT) Caps       TAKE these medications    acetaminophen 325 MG tablet Commonly known as: TYLENOL Take 650 mg by mouth every 6 (six) hours as needed for moderate pain.   ergocalciferol 1.25 MG (50000 UT) capsule Commonly known as: VITAMIN D2 Take 50,000 Units by mouth once a week.   ezetimibe 10 MG tablet Commonly known as: ZETIA Take 10 mg by mouth daily.   fluticasone 50 MCG/ACT nasal spray Commonly known as: FLONASE Place 1 spray into both nostrils daily. Start taking on: Nov 11, 2022   guaiFENesin 200 MG tablet Take 200 mg by mouth every 4 (four)  hours as needed for cough or to loosen phlegm.   levothyroxine 200 MCG tablet Commonly known as: SYNTHROID Take 1 tablet (200 mcg total) by mouth daily before breakfast. What changed:  medication strength how much to  take Another medication with the same name was removed. Continue taking this medication, and follow the directions you see here.   loratadine 10 MG tablet Commonly known as: CLARITIN Take 1 tablet (10 mg total) by mouth daily. Start taking on: Nov 11, 2022   melatonin 5 MG Tabs Take 5 mg by mouth at bedtime.   memantine 10 MG tablet Commonly known as: NAMENDA Take 10 mg by mouth 2 (two) times daily.   risperiDONE 1 MG/ML oral solution Commonly known as: RISPERDAL Take 0.5 mLs (0.5 mg total) by mouth 2 (two) times daily.   senna-docusate 8.6-50 MG tablet Commonly known as: Senokot-S Take 2 tablets by mouth 2 (two) times daily.   sertraline 20 MG/ML concentrated solution Commonly known as: ZOLOFT Take 160 mg by mouth daily. What changed: Another medication with the same name was removed. Continue taking this medication, and follow the directions you see here.   valproic acid 250 MG/5ML solution Commonly known as: DEPAKENE Take 250 mg by mouth 3 (three) times daily.        Contact information for after-discharge care     Destination     [facility] Preferred SNF .   Service: Skilled Nursing Contact information: [address] [place] [address] [phone]                    Discharge Exam: Ceasar Mons Weights   11/06/22 1431  Weight: 64 kg  BP (!) 138/48 (BP Location: Right Arm)   Pulse 73   Temp 98.4 F (36.9 C) (Oral)   Resp 18   Ht 5\' 6"  (1.676 m)   Wt 64 kg   SpO2 93%   BMI 22.77 kg/m   Elderly female in no distress. Had calm night for the most part, slept. Clear and nonlabored, does not take deep breaths/follow commands.  Condition at discharge: stable  The results of significant diagnostics from this hospitalization (including imaging, microbiology, ancillary and laboratory) are listed below for reference.   Imaging Studies: CT Angio Chest PE W and/or Wo Contrast  Result Date: 11/06/2022 CLINICAL DATA:   Weakness. EXAM: CT ANGIOGRAPHY CHEST WITH CONTRAST TECHNIQUE: Multidetector CT imaging of the chest was performed using the standard protocol during bolus administration of intravenous contrast. Multiplanar CT image reconstructions and MIPs were obtained to evaluate the vascular anatomy. RADIATION DOSE REDUCTION: This exam was performed according to the departmental dose-optimization program which includes automated exposure control, adjustment of the mA and/or kV according to patient size and/or use of iterative reconstruction technique. CONTRAST:  75mL OMNIPAQUE IOHEXOL 350 MG/ML SOLN COMPARISON:  Radiograph of same day. FINDINGS: Cardiovascular: Satisfactory opacification of the pulmonary arteries to the segmental level. No evidence of pulmonary embolism. Normal heart size. No pericardial effusion. Mediastinum/Nodes: No enlarged mediastinal, hilar, or axillary lymph nodes. Thyroid gland, trachea, and esophagus demonstrate no significant findings. Lungs/Pleura: No pneumothorax or pleural effusion is noted. Emphysematous disease is noted bilaterally. Mild biapical scarring is noted. Mucous plugging or aspirated material is noted in left lower lobe bronchi. Minimal left posterior basilar subsegmental atelectasis is noted. Upper Abdomen: No acute abnormality. Musculoskeletal: No acute osseous abnormality is noted. Old L1 and L2 compression fractures are noted. Review of the MIP images confirms the above findings. IMPRESSION: No definite  evidence of pulmonary embolus. Mucous plugging or aspirated material is noted in left lower lobe bronchi. Minimal left posterior basilar subsegmental atelectasis is noted. Aortic Atherosclerosis (ICD10-I70.0) and Emphysema (ICD10-J43.9). Electronically Signed   By: Lupita Raider M.D.   On: 11/06/2022 16:24   DG Chest Portable 1 View  Result Date: 11/06/2022 CLINICAL DATA:  Shortness breath and weakness EXAM: PORTABLE CHEST 1 VIEW COMPARISON:  04/25/2022 FINDINGS: The patient's  chin and facial tissues mildly obscure the medial lung apices. Atherosclerotic calcification of the aortic arch. Tapering of the peripheral pulmonary vasculature favors emphysema. Chronic mild bandlike density at the left lung base, probably from scarring. IMPRESSION: 1. Chronic bandlike density at the left lung base, probably from scarring. 2. Aortic Atherosclerosis (ICD10-I70.0) and Emphysema (ICD10-J43.9). Electronically Signed   By: Gaylyn Rong M.D.   On: 11/06/2022 15:21    Microbiology: Results for orders placed or performed during the hospital encounter of 11/06/22  Blood culture (routine x 2)     Status: None (Preliminary result)   Collection Time: 11/06/22  2:59 PM   Specimen: BLOOD RIGHT HAND  Result Value Ref Range Status   Specimen Description   Final    BLOOD RIGHT HAND Performed at [facility], [address] [place], [place] [address]    Special Requests   Final    BOTTLES DRAWN AEROBIC AND ANAEROBIC Blood Culture results may not be optimal due to an inadequate volume of blood received in culture bottles Performed at [facility], [address] [place], [place] [address]    Culture   Final    NO GROWTH 4 DAYS Performed at [facility], [address] [place], [place] [address]    Report Status PENDING  Incomplete  Blood culture (routine x 2)     Status: None (Preliminary result)   Collection Time: 11/06/22  3:04 PM   Specimen: BLOOD RIGHT FOREARM  Result Value Ref Range Status   Specimen Description   Final    BLOOD RIGHT FOREARM Performed at [facility], [address], [place], [place] [address]    Special Requests   Final    BOTTLES DRAWN AEROBIC AND ANAEROBIC Blood Culture results may not be optimal due to an inadequate volume of blood received in culture bottles Performed at [facility], [address] [place], [place] [address]    Culture   Final    NO GROWTH 4 DAYS Performed at [facility], [address], [place], [place] [address]    Report Status PENDING  Incomplete  Resp panel by RT-PCR (RSV, Flu A&B, Covid) Anterior Nasal Swab     Status: None   Collection Time: 11/06/22  3:09 PM   Specimen: Anterior Nasal Swab  Result Value Ref Range Status   SARS Coronavirus 2 by RT PCR NEGATIVE NEGATIVE Final    Comment: (NOTE) SARS-CoV-2 target nucleic acids are NOT DETECTED.  The SARS-CoV-2 RNA is generally detectable in upper respiratory specimens during the acute phase of infection. The lowest concentration of SARS-CoV-2 viral copies this assay can detect is 138 copies/mL. A negative result does not preclude SARS-Cov-2 infection and should not be used as the sole basis for treatment or other patient management decisions. A negative result may occur with  improper specimen collection/handling, submission of specimen other than nasopharyngeal swab, presence of viral mutation(s) within the areas targeted by this assay, and inadequate number of viral copies(<138 copies/mL). A negative result must be  combined with clinical observations, patient history, and epidemiological information. The expected result is Negative.  Fact Sheet for Patients:  BloggerCourse.com  Fact Sheet for Healthcare Providers:  SeriousBroker.it  This test is no t yet approved or cleared by the [place] FDA and  has been authorized for detection and/or diagnosis of SARS-CoV-2 by FDA under an Emergency Use Authorization (EUA). This EUA will remain  in effect (meaning this test can be used) for the duration of the COVID-19 declaration under Section 564(b)(1) of the Act, 21 U.S.C.section 360bbb-3(b)(1), unless the authorization is terminated  or revoked sooner.       Influenza A by PCR NEGATIVE NEGATIVE Final   Influenza B by PCR NEGATIVE NEGATIVE Final    Comment: (NOTE) The Xpert Xpress SARS-CoV-2/FLU/RSV plus assay is intended as an  aid in the diagnosis of influenza from Nasopharyngeal swab specimens and should not be used as a sole basis for treatment. Nasal washings and aspirates are unacceptable for Xpert Xpress SARS-CoV-2/FLU/RSV testing.  Fact Sheet for Patients: BloggerCourse.com  Fact Sheet for Healthcare Providers: SeriousBroker.it  This test is not yet approved or cleared by the [place] FDA and has been authorized for detection and/or diagnosis of SARS-CoV-2 by FDA under an Emergency Use Authorization (EUA). This EUA will remain in effect (meaning this test can be used) for the duration of the COVID-19 declaration under Section 564(b)(1) of the Act, 21 U.S.C. section 360bbb-3(b)(1), unless the authorization is terminated or revoked.     Resp Syncytial Virus by PCR NEGATIVE NEGATIVE Final    Comment: (NOTE) Fact Sheet for Patients: BloggerCourse.com  Fact Sheet for Healthcare Providers: SeriousBroker.it  This test is not yet approved or cleared by the [place] FDA and has been authorized for detection and/or diagnosis of SARS-CoV-2 by FDA under an Emergency Use Authorization (EUA). This EUA will remain in effect (meaning this test can be used) for the duration of the COVID-19 declaration under Section 564(b)(1) of the Act, 21 U.S.C. section 360bbb-3(b)(1), unless the authorization is terminated or revoked.  Performed at [facility], [address], [place], [place] [address]   Urine Culture (for pregnant, neutropenic or urologic patients or patients with an indwelling urinary catheter)     Status: Abnormal   Collection Time: 11/07/22  9:04 AM   Specimen: Urine, Clean Catch  Result Value Ref Range Status   Specimen Description   Final    URINE, CLEAN CATCH Performed at [facility], [address] [place], [place] [address]    Special Requests    Final    NONE Performed at [facility], [address] [place], [place] [address]    Culture (A)  Final    <10,000 COLONIES/mL INSIGNIFICANT GROWTH Performed at [facility], [address] [place], [place] [address]    Report Status 11/09/2022 FINAL  Final    Labs: CBC: Recent Labs  Lab 11/06/22 1507 11/08/22 0540  WBC 9.6 8.6  NEUTROABS 7.3  --   HGB 14.2 12.7  HCT 43.7 39.4  MCV 93.8 95.4  PLT 211 201   Basic Metabolic Panel: Recent Labs  Lab 11/06/22 1507 11/08/22 0540 11/09/22 0541  NA 142 141 142  K 3.8 3.3* 3.3*  CL 103 109 108  CO2 25 23 25   GLUCOSE 117* 109* 105*  BUN 26* 16 14  CREATININE 0.73 0.60 0.67  CALCIUM 8.5* 8.1* 7.9*   Liver Function Tests: Recent Labs  Lab 11/06/22 1507  AST  27  ALT 18  ALKPHOS 41  BILITOT 0.7  PROT 6.4*  ALBUMIN 3.9   CBG: No results for input(s): "GLUCAP" in the last 168 hours.  Discharge time spent: greater than 30 minutes.  Signed: Tyrone Nine, MD [facility] 11/10/2022

## 2022-11-10 NOTE — TOC Transition Note (Signed)
Transition of Care [facility]) - CM/SW Discharge Note   Patient Details  Name: Anne Wiggins MRN: [id number] Date of Birth: [birth year]  [facility]) CM/SW Contact:  Otelia Santee, LCSW Phone Number: 11/10/2022, 11:29 AM   Clinical Narrative:    Pt's daughter accepted bed offer for [facility]. Insurance auth approved for SNF and pt is able to transfer today. Pt will be going room 101a. RN to call report to [phone]. Pt's daughter aware and agreeable to discharge plans. [facility] called at 11:20am for transportation.    Final next level of care: Skilled Nursing Facility Barriers to Discharge: Barriers Resolved   Patient Goals and CMS Choice CMS Medicare.gov Compare Post Acute Care list provided to:: Patient Represenative (must comment) Choice offered to / list presented to : Adult Children  Discharge Placement     Existing PASRR number confirmed : 11/09/22          Patient chooses bed at: [facility] Patient to be transferred to facility by: [facility] Name of family member notified: [name] Patient and family notified of of transfer: 11/10/22  Discharge Plan and Services Additional resources added to the After Visit Summary for   In-house Referral: NA Discharge Planning Services: NA Post Acute Care Choice: Resumption of Svcs/PTA Provider          DME Arranged: N/A DME Agency: NA                  Social Determinants of Health (SDOH) Interventions SDOH Screenings   Tobacco Use: High Risk (11/06/2022)     Readmission Risk Interventions    11/07/2022   12:08 PM  Readmission Risk Prevention Plan  Transportation Screening Complete  PCP or Specialist Appt within 5-7 Days Complete  Home Care Screening Complete  Medication Review (RN CM) Complete

## 2022-11-10 NOTE — Discharge Instructions (Signed)
Patient will be discharged home with home oxygen, she now requires oxygen at baseline, no other acute management needed, she has completed her course of antibiotic per the hospitalist.  She does not show any signs of ongoing pneumonia during her emergency department evaluations today.  Her ears have some wax in the on both sides but otherwise appear clear, no evidence of ear infection.

## 2022-11-10 NOTE — ED Provider Notes (Addendum)
[place] EMERGENCY DEPARTMENT AT [facility] Provider Note   CSN: [id number] Arrival date & time: 11/10/22  1625     History  Chief Complaint  Patient presents with   Shortness of Breath    Anne Wiggins is a 77 y.o. female past medical history significant for hypothyroidism, hyperlipidemia, with recent hospitalization for aspiration pneumonia.  Patient was discharged from the hospital after completion of treatment, she was not discharged on any oxygen, however on making it back to her care facility she was found to be hypoxic with 83 to 84% oxygen saturation on room air.  She does not appear to be in any respiratory distress.  She does not use any oxygen at baseline.   Shortness of Breath      Home Medications Prior to Admission medications   Medication Sig Start Date End Date Taking? Authorizing Provider  acetaminophen (TYLENOL) 325 MG tablet Take 650 mg by mouth every 6 (six) hours as needed for moderate pain. Patient not taking: Reported on 11/08/2022    [provider]  ergocalciferol (VITAMIN D2) 1.25 MG (50000 UT) capsule Take 50,000 Units by mouth once a week.    [provider]  ezetimibe (ZETIA) 10 MG tablet Take 10 mg by mouth daily. 10/18/22   [provider]  fluticasone (FLONASE) 50 MCG/ACT nasal spray Place 1 spray into both nostrils daily. 11/11/22   Tyrone Nine, MD  guaiFENesin 200 MG tablet Take 200 mg by mouth every 4 (four) hours as needed for cough or to loosen phlegm.    [provider]  levothyroxine (SYNTHROID) 200 MCG tablet Take 1 tablet (200 mcg total) by mouth daily before breakfast. 11/10/22   Tyrone Nine, MD  loratadine (CLARITIN) 10 MG tablet Take 1 tablet (10 mg total) by mouth daily. 11/11/22   Tyrone Nine, MD  melatonin 5 MG TABS Take 5 mg by mouth at bedtime.    [provider]  memantine (NAMENDA) 10 MG tablet Take 10 mg by mouth 2 (two) times daily. 04/28/21   [provider]   risperiDONE (RISPERDAL) 1 MG/ML oral solution Take 0.5 mLs (0.5 mg total) by mouth 2 (two) times daily. 11/10/22   Tyrone Nine, MD  senna-docusate (SENOKOT-S) 8.6-50 MG tablet Take 2 tablets by mouth 2 (two) times daily.    [provider]  sertraline (ZOLOFT) 20 MG/ML concentrated solution Take 160 mg by mouth daily.    [provider]  valproic acid (DEPAKENE) 250 MG/5ML solution Take 250 mg by mouth 3 (three) times daily. 10/02/22   [provider]      Allergies    Patient has no known allergies.    Review of Systems   Review of Systems  Respiratory:  Positive for shortness of breath.   All other systems reviewed and are negative.   Physical Exam Updated Vital Signs BP 106/80   Pulse 79   Temp 98.4 F (36.9 C) (Oral)   Resp 17   Ht 5\' 6"  (1.676 m)   Wt 64 kg   SpO2 94%   BMI 22.77 kg/m  Physical Exam Vitals and nursing note reviewed.  Constitutional:      General: She is not in acute distress.    Appearance: Normal appearance.  HENT:     Head: Normocephalic and atraumatic.     Comments: Patient with some cerumen in bilateral ears but no evidence of acute otitis media.    Right Ear: Tympanic membrane, ear canal  and external ear normal.     Left Ear: Tympanic membrane, ear canal and external ear normal.  Eyes:     General:        Right eye: No discharge.        Left eye: No discharge.  Cardiovascular:     Rate and Rhythm: Normal rate and regular rhythm.  Pulmonary:     Effort: Pulmonary effort is normal. No respiratory distress.  Musculoskeletal:        General: No deformity.  Skin:    General: Skin is warm and dry.  Neurological:     Mental Status: She is alert and oriented to person, place, and time.  Psychiatric:        Mood and Affect: Mood normal.        Behavior: Behavior normal.     ED Results / Procedures / Treatments   Labs (all labs ordered are listed, but only abnormal results are displayed) Labs Reviewed - No data  to display  EKG None  Radiology No results found.  Procedures Procedures    Medications Ordered in ED Medications  acetaminophen (TYLENOL) tablet 650 mg (has no administration in time range)  ezetimibe (ZETIA) tablet 10 mg (has no administration in time range)  fluticasone (FLONASE) 50 MCG/ACT nasal spray 1 spray (has no administration in time range)  guaiFENesin tablet 200 mg (has no administration in time range)  levothyroxine (SYNTHROID) tablet 200 mcg (has no administration in time range)  loratadine (CLARITIN) tablet 10 mg (has no administration in time range)  melatonin tablet 5 mg (has no administration in time range)  memantine (NAMENDA) tablet 10 mg (has no administration in time range)  risperiDONE (RISPERDAL) 1 MG/ML oral solution 0.5 mg (has no administration in time range)  senna-docusate (Senokot-S) tablet 2 tablet (has no administration in time range)  sertraline (ZOLOFT) 20 MG/ML concentrated solution 160 mg (has no administration in time range)  valproic acid (DEPAKENE) 250 MG/5ML solution 250 mg (has no administration in time range)    ED Course/ Medical Decision Making/ A&P Clinical Course as of 11/10/22 2127  Fri Nov 10, 2022  2124 [place] until tomorrow morning [CP]    Clinical Course User Index [CP] [name], PA-C                              Medical Decision Making Risk OTC drugs. Prescription drug management.   This patient is a 77 y.o. female who presents to the ED for concern of hypoxia after hospital discharge, daughter also with concern about whether patient could have ear infection.   Differential diagnoses prior to evaluation: Ongoing or worsening hypoxia from her recent diagnosis of aspiration pneumonia, versus new or inadequately treated pneumonia, vs her home oxygen without a diagnosis.  Past Medical History / Social History / Additional history: Chart reviewed. Pertinent results include: Extensively reviewed her recent  emergency department evaluation, as well as hospital stay, she was just discharged, she did not have any antibiotics to go home on, her vital signs are stable other than elevated blood pressure on arrival, blood pressure 150/60, and notable 84% oxygen saturation on room air.  She has had stable oxygen saturation greater than 90% on 4 L nasal cannula.  Will discharge her with home oxygen, spoke to social work who will put in the request, put in a DME order for this patient  Physical Exam: Physical exam performed. The pertinent findings include:  Mildly hypertensive on arrival, blood pressure 150/60, 1% on 4 L nasal cannula, 84% on room air.  Bilateral tympanic membranes with some cerumen, but no evidence of acute otitis media..  Medications / Treatment: Will discharge with home oxygen, otherwise, no acute management needed or needed in the emergency department.   Disposition: After consideration of the diagnostic results and the patients response to treatment, I feel that patient is stable for discharge as well as the hospital plan, she will be discharged on home oxygen.Marland Kitchen   emergency department workup does not suggest an emergent condition requiring admission or immediate intervention beyond what has been performed at this time. The plan is: as above. The patient is safe for discharge and has been instructed to return immediately for worsening symptoms, change in symptoms or any other concerns.   Patient is stable for discharge, however her facility refused to take her back despite [place] arranging for her to have home oxygen and they "wanted to make sure that her oxygen stayed stable for at least a day on oxygen".  She has had no desaturations in the emergency department.  I have ordered her home medications she will be boarding until tomorrow morning Final Clinical Impression(s) / ED Diagnoses Final diagnoses:  Hypoxia    Rx / DC Orders ED Discharge Orders     None         [name], PA-C 11/10/22 1836    [place] 11/10/22 2127    Gwyneth Sprout, MD 11/10/22 2242

## 2022-11-10 NOTE — ED Notes (Signed)
Pt is unable to walk for pulse oximetry

## 2022-11-10 NOTE — Care Management (Addendum)
Transition of Care [facility]) - Emergency Department Mini Assessment   Patient Details  Name: Anne Wiggins MRN: [id number] Date of Birth: 1946-03-18  Transition of Care [facility] CM/SW Contact:    Lavenia Atlas, RN Phone Number: 11/10/2022, 5:49 PM   Clinical Narrative: Per chart review patient discharged from [facility] 2 hours ago, presented to [facility] ED from [facility] care,SNF took patients vitals and her oxygen was 84% room air.  This RNCM spoke with Kia with [facility] who reports patient's O2 sats were in the 80% when presented to SNF. Kia will accept patient back once medically stable.   This RNCM spoke with patient's daughter Anne Wiggins [phone], who is requesting EDP assess patient's ears for infection. Anne Wiggins was indicated frustration due to her mother being discharged and presenting to SNF with low oxygen levels. This RNCM notified EDP, RN.  This RNCM left voicemail for Jermaine with [employer] to obtain home oxygen, awaiting a call back. This RNCM left voicemail message w/ Barbara Cower with [employer] to obtain home oxygen, awaiting a call back.   TOC will continue to follow  This RNCM spoke with [place] with [employer] [phone] will need orders faxed to [phone]. SW aware to follow up as patient can not be discharged until home oxygen has been delivered to bedside.  Orders have been faxed to [employer]. ED SW will follow post this RNCM shift.   TOC following for needs.  ED Mini Assessment: What brought you to the Emergency Department? : oxygen sats at 80% on RA  Barriers to Discharge: Continued Medical Work up  [employer] interventions: coordinate home oxygen  Means of departure: Ambulance  Interventions which prevented an admission or readmission: DME Provided    Patient Contact and Communications        ,            CMS Medicare.gov Compare Post Acute Care list provided to:: Patient Represenative (must comment) Anne Wiggins (daughter)) Choice offered to / list presented to : Adult  Children  Admission diagnosis:  pneumonia Patient Active Problem List   Diagnosis Date Noted   Aspiration pneumonia (HCC) 11/06/2022   Hypothyroidism 07/18/2011   Dyslipidemia 07/18/2011   PCP:  Pcp, No Pharmacy:   [facility] [address] [place], [place] - [address] [place] [address] Phone: [phone] Fax: [phone]  [facility] [address] [place] [address] Phone: [phone] Fax: [phone]

## 2022-11-10 NOTE — Progress Notes (Signed)
CSW informed by daughter and [facility] that pt's sats have been in the 80's and have not been able to resolve after several hours. [facility] is having pt sent to ED. Pt's daughter would like a call once pt in room. Daughter, Glendon Axe can be reached at [phone].

## 2022-11-11 LAB — CULTURE, BLOOD (ROUTINE X 2)
Culture: NO GROWTH
Culture: NO GROWTH

## 2022-11-11 NOTE — ED Notes (Signed)
Report given to Victorino Dike, Charity fundraiser at [employer].

## 2022-11-11 NOTE — ED Provider Notes (Signed)
Emergency Medicine Observation Re-evaluation Note  Rashetta Playford is a 77 y.o. female, seen on rounds today.  Pt initially presented to the ED for complaints of Shortness of Breath Currently, the patient is resting.  Physical Exam  BP (!) 139/57   Pulse 71   Temp 98.4 F (36.9 C) (Oral)   Resp 15   Ht 5\' 6"  (1.676 m)   Wt 64 kg   SpO2 99%   BMI 22.77 kg/m  Physical Exam General: resting comfortably, NAD Lungs: normal WOB Psych: currently calm and resting  ED Course / MDM  EKG:   I have reviewed the labs performed to date as well as medications administered while in observation.  Recent changes in the last 24 hours include none.  Plan  Current plan is for discharge back to facility, TOC has been involved and patient is appropriate for discharge.    Rozelle Logan, [place] 11/11/22 1610

## 2022-11-11 NOTE — ED Notes (Signed)
Attempted to call report to [facility] with no answer. This nurse was unable to leave message due to no answering machine, will attempt to call report again.

## 2022-11-11 NOTE — ED Notes (Signed)
[facility] called for transport.  

## 2022-11-11 NOTE — Progress Notes (Addendum)
This CSW has outreached to [employer] without success. Left HIPAA Compliant voicemail.   Addend @ 9:29 AM Jasmine with [employer] returned call and this CSW informed that the o2 was not needed.

## 2022-11-11 NOTE — Progress Notes (Addendum)
This CSW communicated via secure chat with RN/EDP and it was reported pt's o2 was stable overnight. This CSW provided RN with report #. [facility] to transport. The above information has also been communicated to Kia at [facility] and the pt's daughter - both are in agreement with the pt returning to [facility]. Please note: Kia at [facility] reported o2 is at the facility for the pt if needed. No further TOC needs at this time.   Addend @ 9:34 AM This CSW spoke with [facility] who informed pt's authorization is fine and she is fine to return.   Addend @ 2:39 PM Attempted to contact pt's daughter to inform she'd not been d/c yet. Left HIPAA Compliant voicemail. RN to notify daughter at discharge.

## 2022-11-11 NOTE — Progress Notes (Signed)
This CSW spoke with Kia at [facility] who reports the pt was admitted to their facility on o2. Kia reports the pt's daughter expressed concerns to their Administrator of the pt being medically stable for discharge from her hospital stay. Administrator decided to send pt back to be evaluated. Kia reports the pt may return to the SNF if she remained stable overnight. This CSW will outreach to EDP and RN.

## 2022-12-09 ENCOUNTER — Emergency Department (HOSPITAL_COMMUNITY): Payer: Medicare HMO

## 2022-12-09 ENCOUNTER — Other Ambulatory Visit: Payer: Self-pay

## 2022-12-09 ENCOUNTER — Emergency Department (HOSPITAL_COMMUNITY)
Admission: EM | Admit: 2022-12-09 | Discharge: 2022-12-09 | Disposition: A | Discharge status: Home / Self Care | Payer: Medicare HMO | Attending: Emergency Medicine | Admitting: Emergency Medicine

## 2022-12-09 ENCOUNTER — Encounter (HOSPITAL_COMMUNITY): Payer: Self-pay

## 2022-12-09 DIAGNOSIS — M25512 Pain in left shoulder: Secondary | ICD-10-CM | POA: Diagnosis present

## 2022-12-09 DIAGNOSIS — M79652 Pain in left thigh: Secondary | ICD-10-CM | POA: Insufficient documentation

## 2022-12-09 DIAGNOSIS — W06XXXA Fall from bed, initial encounter: Secondary | ICD-10-CM | POA: Insufficient documentation

## 2022-12-09 DIAGNOSIS — W19XXXA Unspecified fall, initial encounter: Secondary | ICD-10-CM

## 2022-12-09 DIAGNOSIS — F0390 Unspecified dementia without behavioral disturbance: Secondary | ICD-10-CM | POA: Insufficient documentation

## 2022-12-09 NOTE — ED Notes (Signed)
[id number] contacted for [facility] transport.

## 2022-12-09 NOTE — ED Triage Notes (Signed)
Pt arrives via EMS after being found in the floor by nursing staff next to her low bed at [facility] where she lives. Pt has pain with palpation in left shoulder but is laying more on that side. Pt is alert and pleasant to only her name. EMS reports pt has just finished rehab after a left femur fracture. No obvious deformity observed to any extremity, and no obvious outward signs of trauma observed. Pt has severe dementia at baseline and often times is non-verbal with nursing home staff, so [facility] is baseline for pt at this time.

## 2022-12-09 NOTE — ED Provider Notes (Signed)
[place] EMERGENCY DEPARTMENT AT [facility] Provider Note   CSN: [id number] Arrival date & time: 12/09/22  2017     History Chief Complaint  Patient presents with   Fall    Unwitnessed found on floor next to her low bed    Shoulder Injury    HPI Anne Wiggins is a 77 y.o. female presenting for follow-up fall at a skilled nursing facility.  Fell off of a low bed onto a padded floor per EMS at the facility.  Fell onto her left side and is endorsing left-sided shoulder pain and left femur pain.  Denies fevers chills nausea vomiting syncope or shortness of breath.  Patient's recorded medical, surgical, social, medication list and allergies were reviewed in the Snapshot window as part of the initial history.   Review of Systems   Review of Systems  Unable to perform ROS: Dementia    Physical Exam Updated Vital Signs BP (!) 161/60   Pulse 67   Temp 98.1 F (36.7 C) (Oral)   Resp 17   Ht 5\' 6"  (1.676 m)   Wt 64 kg   SpO2 100%   BMI 22.77 kg/m  Physical Exam Vitals and nursing note reviewed.  Constitutional:      General: She is not in acute distress.    Appearance: She is well-developed.  HENT:     Head: Normocephalic and atraumatic.  Eyes:     Conjunctiva/sclera: Conjunctivae normal.  Cardiovascular:     Rate and Rhythm: Normal rate and regular rhythm.     Heart sounds: No murmur heard. Pulmonary:     Effort: Pulmonary effort is normal. No respiratory distress.     Breath sounds: Normal breath sounds.  Abdominal:     General: There is no distension.     Palpations: Abdomen is soft.     Tenderness: There is no abdominal tenderness. There is no right CVA tenderness or left CVA tenderness.  Musculoskeletal:        General: No swelling or tenderness. Normal range of motion.     Cervical back: Neck supple.  Skin:    General: Skin is warm and dry.  Neurological:     General: No focal deficit present.     Mental Status: She is alert and oriented to  person, place, and time. Mental status is at baseline.     Cranial Nerves: No cranial nerve deficit.      ED Course/ Medical Decision Making/ A&P Clinical Course as of 12/09/22 2236  Sat Dec 09, 2022  2144 Sarina Ser [CC]    Clinical Course User Index [CC] Glyn Ade, MD    Procedures Procedures   Medications Ordered in ED Medications - No data to display Medical Decision Making:   Anne Wiggins is a 77 y.o. female who presented to the ED today with a fall at their living facility detailed above. They are not on a blood thinner. Complete initial physical exam performed, notably the patient  was hemodynamically stable  in no acute distress. No obvious deformities or injuries appreciated on extensive physical exam including active range of motion of all joints.     Reviewed and confirmed nursing documentation for past medical history, family history, social history.    Initial Assessment/Plan:   This is a patient presenting with a moderate blunt mechanism trauma.  As such, I have considered intracranial injuries including intracranial hemorrhage, intrathoracic injuries including blunt myocardial or blunt lung injury, blunt abdominal injuries including aortic dissection, bladder injury, spleen  injury, liver injury and I have considered orthopedic injuries including extremity or spinal injury. This is most consistent with an acute life/limb threatening illness complicated by underlying chronic conditions.  With the patient's presentation of moderate mechanism trauma but an otherwise reassuring exam, patient warrants targeted evaluation for potential traumatic injuries. Will proceed with targeted evaluation for potential injuries. Will proceed with Left shoulder, hip, femur XR as well as chest x-ray.  Images reviewed and agree with radiology interpretation.  DG Pelvis Portable  Result Date: 12/09/2022 CLINICAL DATA:  Patient found on the floor. EXAM: PORTABLE PELVIS 1-2 VIEWS  COMPARISON:  April 25, 2022 FINDINGS: An intact left hip replacement is seen. There is no evidence of pelvic fracture or diastasis. No pelvic bone lesions are seen. IMPRESSION: 1. No acute fracture or dislocation. 2. Intact left hip replacement. Electronically Signed   By: Aram Candela M.D.   On: 12/09/2022 22:00   DG Chest Portable 1 View  Result Date: 12/09/2022 CLINICAL DATA:  Patient found on the floor. EXAM: PORTABLE CHEST 1 VIEW COMPARISON:  Nov 06, 2022 FINDINGS: The cardiac silhouette is borderline in size. There is marked severity calcification of the aortic arch. There is evidence of emphysematous lung disease with mild, diffuse, chronic appearing increased interstitial lung markings. Mild atelectasis and/or infiltrate is seen within the left lung base. A small left pleural effusion is suspected. No pneumothorax is identified. Multilevel degenerative changes are seen throughout the thoracic spine. IMPRESSION: 1. COPD and mild left basilar atelectasis and/or infiltrate. 2. Small left pleural effusion. Electronically Signed   By: Aram Candela M.D.   On: 12/09/2022 21:52   DG FEMUR MIN 2 VIEWS LEFT  Result Date: 12/09/2022 CLINICAL DATA:  Patient found on the floor. EXAM: LEFT FEMUR 2 VIEWS COMPARISON:  January 11, 2021 FINDINGS: An intact left hip replacement is seen. There is no evidence of an acute fracture or other focal bone lesions. Soft tissues are unremarkable. IMPRESSION: Intact left hip replacement. Electronically Signed   By: Aram Candela M.D.   On: 12/09/2022 21:47   DG Shoulder Left  Result Date: 12/09/2022 CLINICAL DATA:  Patient found on the floor. EXAM: LEFT SHOULDER - 2+ VIEW COMPARISON:  None Available. FINDINGS: There is no evidence of fracture or dislocation. There is no evidence of arthropathy or other focal bone abnormality. Soft tissues are unremarkable. IMPRESSION: Negative. Electronically Signed   By: Aram Candela M.D.   On: 12/09/2022 21:43    Final  Reassessment and Plan:   Had a long conversation with patient's daughter on the phone. She has been at her mental status baseline as recently as yesterday.  Her respiratory status continues to slowly improve.  She has had no further fevers chills nausea vomiting syncope shortness of breath.  Today's fall seems to be an aberrancy.  She has continued to have discolored foul-smelling urine and had a repeat urinalysis that was nondiagnostic just a few days ago.  Her urobilinogen was elevated.  On chart review she is still on Ezetimibe which may be related to her elevated bilirubin though this needs to be explored with PCP for further recommendations. X-ray revealed a left-sided small pleural effusion in the setting of COPD and recent respiratory illness. Respiratory status is at baseline and empyema is therefore considered much less likely especially given lack of fever or respiratory distress.  Unlikely to be hemothorax or traumatic injury given appearance. Given lack of acute findings tonight, patient is stable for further workup and care and management with  her PCP.  May need outpatient thoracentesis or further diagnostic criteria causing worsening respiratory distress, will need a repeat x-ray for her interval resolution and ongoing monitoring by PCP in outpatient setting.  Disposition:  I have considered need for hospitalization, however, considering all of the above, I believe this patient is stable for discharge at this time.  Patient/family expressed understanding of return precautions and need for follow-up. Patient spoken to regarding all imaging and laboratory results and appropriate follow up for these results. All education provided in verbal form with additional information in written form. Time was allowed for answering of patient questions. Patient discharged.    Emergency Department Medication Summary:   Medications - No data to display       Clinical Impression:  1. Fall, initial  encounter      Discharge   Final Clinical Impression(s) / ED Diagnoses Final diagnoses:  Fall, initial encounter    Rx / DC Orders ED Discharge Orders     None         Glyn Ade, MD 12/09/22 2236

## 2022-12-09 NOTE — Discharge Instructions (Addendum)
X-ray revealed a left-sided small pleural effusion in the setting of COPD and recent respiratory illness. Respiratory status is at baseline and empyema is therefore considered much less likely especially given lack of fever or respiratory distress.  Unlikely to be hemothorax or traumatic injury given appearance. Given lack of acute findings tonight, patient is stable for further workup and care and management with her PCP.  May need outpatient thoracentesis or further diagnostic criteria causing worsening respiratory distress, will need a repeat x-ray for her interval resolution and ongoing monitoring by PCP in outpatient setting.

## 2023-03-20 ENCOUNTER — Other Ambulatory Visit: Payer: Self-pay

## 2023-03-20 ENCOUNTER — Encounter (HOSPITAL_COMMUNITY): Payer: Self-pay | Admitting: Radiology

## 2023-03-20 ENCOUNTER — Emergency Department (HOSPITAL_COMMUNITY)
Admission: EM | Admit: 2023-03-20 | Discharge: 2023-03-21 | Disposition: A | Discharge status: Home / Self Care | Attending: Emergency Medicine | Admitting: Emergency Medicine

## 2023-03-20 ENCOUNTER — Emergency Department (HOSPITAL_COMMUNITY)

## 2023-03-20 DIAGNOSIS — S0083XA Contusion of other part of head, initial encounter: Secondary | ICD-10-CM | POA: Diagnosis not present

## 2023-03-20 DIAGNOSIS — F0390 Unspecified dementia without behavioral disturbance: Secondary | ICD-10-CM | POA: Diagnosis not present

## 2023-03-20 DIAGNOSIS — W228XXA Striking against or struck by other objects, initial encounter: Secondary | ICD-10-CM | POA: Diagnosis not present

## 2023-03-20 DIAGNOSIS — N3000 Acute cystitis without hematuria: Secondary | ICD-10-CM | POA: Diagnosis not present

## 2023-03-20 DIAGNOSIS — S0990XA Unspecified injury of head, initial encounter: Secondary | ICD-10-CM | POA: Diagnosis present

## 2023-03-20 DIAGNOSIS — R451 Restlessness and agitation: Secondary | ICD-10-CM | POA: Diagnosis not present

## 2023-03-20 DIAGNOSIS — Z7901 Long term (current) use of anticoagulants: Secondary | ICD-10-CM | POA: Diagnosis not present

## 2023-03-20 DIAGNOSIS — R456 Violent behavior: Secondary | ICD-10-CM | POA: Insufficient documentation

## 2023-03-20 LAB — URINALYSIS, ROUTINE W REFLEX MICROSCOPIC
Bilirubin Urine: NEGATIVE
Glucose, UA: NEGATIVE mg/dL
Hgb urine dipstick: NEGATIVE
Ketones, ur: NEGATIVE mg/dL
Nitrite: POSITIVE — AB
Protein, ur: 30 mg/dL — AB
Specific Gravity, Urine: 1.01 (ref 1.005–1.030)
WBC, UA: >50 WBC/hpf (ref 0–5)
pH: 6 (ref 5.0–8.0)

## 2023-03-20 MED ORDER — CEPHALEXIN 500 MG PO CAPS: 500.0000 mg | ORAL_CAPSULE | Freq: Two times a day (BID) | ORAL | 0 refills | Status: AC

## 2023-03-20 NOTE — ED Provider Notes (Signed)
EMERGENCY DEPARTMENT AT [facility] Provider Note   CSN: [id number] Arrival date & time: 03/20/23  1841     History  Chief Complaint  Patient presents with   Head Injury    Anne Wiggins is a 77 y.o. female with history of hyperlipidemia, thyroid disease, UTI, dementia, currently at [facility] memory care, DNR, who presents to the emergency department after a head injury.  Staff reports that patient became very angry and agitated, and banged her head into a wall.  They did not note any loss of consciousness.  Patient is on Eliquis.  Did sustain a small hematoma to her right forehead.  Level 5 caveat due to dementia  EMS staff reports they have seen patient several times, and she appears to be more cooperative and alert than her usual. Daughter called nursing staff and asked if patient could be tested for UTI.    Head Injury      Home Medications Prior to Admission medications   Medication Sig Start Date End Date Taking? Authorizing Provider  acetaminophen (TYLENOL) 325 MG tablet Take 650 mg by mouth every 6 (six) hours as needed for moderate pain.   Yes [provider]  ELIQUIS 5 MG TABS tablet Take 5 mg by mouth 2 (two) times daily. 03/16/23  Yes [provider]  fluticasone (FLONASE) 50 MCG/ACT nasal spray Place 1 spray into both nostrils daily. 11/11/22  Yes Tyrone Nine, MD  guaiFENesin (MUCINEX) 600 MG 12 hr tablet Take 600 mg by mouth 2 (two) times daily.   Yes [provider]  levothyroxine (SYNTHROID) 200 MCG tablet Take 1 tablet (200 mcg total) by mouth daily before breakfast. 11/10/22  Yes Tyrone Nine, MD  levothyroxine (SYNTHROID) 25 MCG tablet Take 25 mcg by mouth daily before breakfast.   Yes [provider]  loratadine (CLARITIN) 10 MG tablet Take 1 tablet (10 mg total) by mouth daily. 11/11/22  Yes Tyrone Nine, MD  Melatonin (MELATONIN FAST DISSOLVE EX STR) 5 MG TBDP Take 5 mg by mouth at bedtime.   Yes [provider]  memantine (NAMENDA) 10 MG tablet Take 10 mg by mouth 2 (two) times daily. 04/28/21  Yes [provider]  predniSONE (DELTASONE) 10 MG tablet Take 10 mg by mouth daily. 03/13/23  Yes [provider]  risperiDONE (RISPERDAL) 1 MG/ML oral solution Take 0.5 mLs (0.5 mg total) by mouth 2 (two) times daily. 11/10/22  Yes Tyrone Nine, MD  senna-docusate (SENOKOT-S) 8.6-50 MG tablet Take 2 tablets by mouth 2 (two) times daily.   Yes [provider]  sertraline (ZOLOFT) 20 MG/ML concentrated solution Take 160 mg by mouth daily.   Yes [provider]  trimethoprim (TRIMPEX) 100 MG tablet Take 100 mg by mouth daily.   Yes [provider]  valproic acid (DEPAKENE) 250 MG/5ML solution Take 125 mg by mouth 3 (three) times daily. 10/02/22  Yes [provider]  cephALEXin (KEFLEX) 500 MG capsule Take 1 capsule (500 mg total) by mouth 2 (two) times daily for 7 days. 03/20/23 03/27/23 Yes Dexton Zwilling T, PA-C      Allergies    Patient has no known allergies.    Review of Systems   Review of Systems  Unable to perform ROS: Dementia    Physical Exam Updated Vital Signs BP (!) 151/61   Pulse 85   Temp (!) 97 F (36.1 C) (Axillary)   Resp 18   Ht 5\' 6"  (1.676  m)   Wt 64 kg   SpO2 95%   BMI 22.76 kg/m  Physical Exam Vitals and nursing note reviewed.  Constitutional:      Appearance: Normal appearance.  HENT:     Head: Normocephalic. Contusion present.     Comments: Contusion to the right forehead, about 1.5 cm in diameter Eyes:     Conjunctiva/sclera: Conjunctivae normal.  Cardiovascular:     Rate and Rhythm: Normal rate and regular rhythm.  Pulmonary:     Effort: Pulmonary effort is normal. No respiratory distress.     Breath sounds: Normal breath sounds.  Chest:     Comments: Chest wall stable  Abdominal:     General: There is no distension.     Palpations: Abdomen is soft.     Tenderness: There is no abdominal  tenderness.  Skin:    General: Skin is warm and dry.  Neurological:     General: No focal deficit present.     Mental Status: She is alert. Mental status is at baseline.     ED Results / Procedures / Treatments   Labs (all labs ordered are listed, but only abnormal results are displayed) Labs Reviewed  URINALYSIS, ROUTINE W REFLEX MICROSCOPIC - Abnormal; Notable for the following components:      Result Value   Color, Urine AMBER (*)    APPearance TURBID (*)    Protein, ur 30 (*)    Nitrite POSITIVE (*)    Leukocytes,Ua LARGE (*)    Bacteria, UA MANY (*)    All other components within normal limits  URINE CULTURE    EKG EKG Interpretation Date/Time:  Tuesday March 20 2023 19:07:52 EDT Ventricular Rate:  83 PR Interval:  144 QRS Duration:  98 QT Interval:  406 QTC Calculation: 478 R Axis:   65  Text Interpretation: Sinus rhythm Minimal ST depression, anterolateral leads Confirmed by Glyn Ade [phone]) on 03/20/2023 11:09:51 PM  Radiology CT Head Wo Contrast  Result Date: 03/20/2023 CLINICAL DATA:  Increased agitation with subsequent banging of head into the wall, initial encounter EXAM: CT HEAD WITHOUT CONTRAST CT CERVICAL SPINE WITHOUT CONTRAST TECHNIQUE: Multidetector CT imaging of the head and cervical spine was performed following the standard protocol without intravenous contrast. Multiplanar CT image reconstructions of the cervical spine were also generated. RADIATION DOSE REDUCTION: This exam was performed according to the departmental dose-optimization program which includes automated exposure control, adjustment of the mA and/or kV according to patient size and/or use of iterative reconstruction technique. COMPARISON:  04/25/2022 FINDINGS: CT HEAD FINDINGS Brain: No evidence of acute infarction, hemorrhage, hydrocephalus, extra-axial collection or mass lesion/mass effect. Chronic atrophic and ischemic changes are noted. Vascular: No hyperdense vessel or  unexpected calcification. Skull: Normal. Negative for fracture or focal lesion. Sinuses/Orbits: No acute finding. Other: None. CT CERVICAL SPINE FINDINGS Alignment: Loss of normal cervical lordosis is noted. Skull base and vertebrae: 7 cervical segments are well visualized. Vertebral body height is well maintained. Multilevel osteophytic changes are seen with disc space narrowing. Facet hypertrophic changes are noted as well. No acute fracture or acute facet abnormality is noted. Soft tissues and spinal canal: Surrounding soft tissue structures are within normal limits. Upper chest: Visualized lung apices demonstrate emphysematous changes. Atherosclerotic calcifications of the thoracic aorta are noted. Other: None IMPRESSION: CT of the head: Chronic atrophic and ischemic changes without acute abnormality. CT of the cervical spine: Multilevel degenerative change without acute abnormality. Aortic Atherosclerosis (ICD10-I70.0) and Emphysema (ICD10-J43.9). Electronically Signed   By:  Alcide Clever M.D.   On: 03/20/2023 22:34   CT Cervical Spine Wo Contrast  Result Date: 03/20/2023 CLINICAL DATA:  Increased agitation with subsequent banging of head into the wall, initial encounter EXAM: CT HEAD WITHOUT CONTRAST CT CERVICAL SPINE WITHOUT CONTRAST TECHNIQUE: Multidetector CT imaging of the head and cervical spine was performed following the standard protocol without intravenous contrast. Multiplanar CT image reconstructions of the cervical spine were also generated. RADIATION DOSE REDUCTION: This exam was performed according to the departmental dose-optimization program which includes automated exposure control, adjustment of the mA and/or kV according to patient size and/or use of iterative reconstruction technique. COMPARISON:  04/25/2022 FINDINGS: CT HEAD FINDINGS Brain: No evidence of acute infarction, hemorrhage, hydrocephalus, extra-axial collection or mass lesion/mass effect. Chronic atrophic and ischemic changes  are noted. Vascular: No hyperdense vessel or unexpected calcification. Skull: Normal. Negative for fracture or focal lesion. Sinuses/Orbits: No acute finding. Other: None. CT CERVICAL SPINE FINDINGS Alignment: Loss of normal cervical lordosis is noted. Skull base and vertebrae: 7 cervical segments are well visualized. Vertebral body height is well maintained. Multilevel osteophytic changes are seen with disc space narrowing. Facet hypertrophic changes are noted as well. No acute fracture or acute facet abnormality is noted. Soft tissues and spinal canal: Surrounding soft tissue structures are within normal limits. Upper chest: Visualized lung apices demonstrate emphysematous changes. Atherosclerotic calcifications of the thoracic aorta are noted. Other: None IMPRESSION: CT of the head: Chronic atrophic and ischemic changes without acute abnormality. CT of the cervical spine: Multilevel degenerative change without acute abnormality. Aortic Atherosclerosis (ICD10-I70.0) and Emphysema (ICD10-J43.9). Electronically Signed   By: Alcide Clever M.D.   On: 03/20/2023 22:34   DG Pelvis Portable  Result Date: 03/20/2023 CLINICAL DATA:  Injury. Patient became agitated and banged her head into the wall. Small hematoma to the forehead. EXAM: PORTABLE PELVIS 1-2 VIEWS COMPARISON:  12/09/2022 FINDINGS: Postoperative changes with left hip hemiarthroplasty. Cemented component appears well seated. Tip of the component is not included within the field of view. Possible extrinsic foreign object or catheter projecting over the low pelvis. Correlate with physical examination. Degenerative changes in the lower lumbar spine and right hip. No evidence of acute fracture or dislocation. No focal bone lesion or bone destruction. Visualized sacrum appears intact. SI joints and symphysis pubis are not displaced. IMPRESSION: 1. Left hip hemiarthroplasty appears well seated. 2. Possible catheter or extrinsic foreign object projecting over the  left pelvis. Correlate with physical examination. 3. Degenerative changes. 4. No acute bony abnormalities. Electronically Signed   By: Burman Nieves M.D.   On: 03/20/2023 20:32   DG Chest Portable 1 View  Result Date: 03/20/2023 CLINICAL DATA:  Injury. Patient became agitated and banged her head into the wall. Small hematoma to the right forehead. EXAM: PORTABLE CHEST 1 VIEW COMPARISON:  12/09/2022 FINDINGS: Cardiac enlargement. No vascular congestion or edema. Emphysematous changes in the lungs. Suggestion of small left pleural effusion with basilar atelectasis, similar to prior study. No pneumothorax. Mediastinal contours appear intact. Calcification of the aorta. Degenerative changes in the spine. Old appearing left rib fractures. IMPRESSION: Emphysematous changes in the lungs. Probable small left pleural effusion with basilar atelectasis, unchanged. Electronically Signed   By: Burman Nieves M.D.   On: 03/20/2023 20:30    Procedures Procedures    Medications Ordered in ED Medications - No data to display  ED Course/ Medical Decision Making/ A&P Clinical Course as of 03/20/23 2315  Tue Mar 20, 2023  4098 Agree with plan  of care per PA. [CC]    Clinical Course User Index [CC] Glyn Ade, MD                                 Medical Decision Making Amount and/or Complexity of Data Reviewed Labs: ordered. Radiology: ordered.  This patient is a 77 y.o. female  who presents to the ED for concern of head trauma.   Differential diagnoses prior to evaluation: The emergent differential diagnosis includes, but is not limited to,  skull fracture, intracranial hemorrhage, cervical injury. This is not an exhaustive differential.   Past Medical History / Co-morbidities / Social History: hyperlipidemia, thyroid disease, UTI, dementia, currently at [facility] memory care, DNR  Physical Exam: Physical exam performed. The pertinent findings include: Patient Nurolon clear baseline.   Hypertensive, otherwise normal vital signs.  No acute distress.  Hematoma about 1.5 cm in diameter noted to forehead.  No other deformities noted on trauma examination.  Lab Tests/Imaging studies: I personally interpreted labs/imaging and the pertinent results include:  CT head and cervical spine without acute abnormalities. Chest and pelvis x-rays without acute abnormalities. I agree with the radiologist interpretation.  [place] with positive nitrites, large leukocytes, > 50 WBCs, many bacteria, and WBC clumps presents. Urine culture sent.   Cardiac monitoring: EKG obtained and interpreted by myself and attending physician which shows: sinus rhythm   Disposition: After consideration of the diagnostic results and the patients response to treatment, I feel that emergency department workup does not suggest an emergent condition requiring admission or immediate intervention beyond what has been performed at this time. The plan is: discharge back to facility with antibiotic prescription for UTI. Not septic. Appears to be at baseline. No traumatic findings on imaging. The patient is safe for discharge and has been instructed to return immediately for worsening symptoms, change in symptoms or any other concerns.  I discussed this case with my attending physician Dr. Doran Durand who cosigned this note including patient's presenting symptoms, physical exam, and planned diagnostics and interventions. Attending physician stated agreement with plan or made changes to plan which were implemented.   Final Clinical Impression(s) / ED Diagnoses Final diagnoses:  Injury of head, initial encounter  Acute cystitis without hematuria    Rx / DC Orders ED Discharge Orders          Ordered    cephALEXin (KEFLEX) 500 MG capsule  2 times daily        03/20/23 2313           Portions of this report may have been transcribed using voice recognition software. Every effort was made to ensure accuracy; however,  inadvertent computerized transcription errors may be present.    Su Monks, PA-C 03/20/23 2315    Glyn Ade, MD 03/21/23 1504

## 2023-03-20 NOTE — ED Notes (Signed)
C-collar remove as ordered

## 2023-03-20 NOTE — Progress Notes (Signed)
Orthopedic Tech Progress Note Patient Details:  Anne Wiggins 15-Nov-1945 [id number] Level 2 Trauma. Not needed Patient ID: Anne Wiggins, female   DOB: Oct 21, 1945, 77 y.o.   MRN: [id number]  [name] 03/20/2023, 7:16 PM

## 2023-03-20 NOTE — ED Triage Notes (Signed)
Pt became angry and agitated at her facility and banged her head into the wall. Pt has a small hematoma to her right forehead.

## 2023-03-20 NOTE — Discharge Instructions (Addendum)
Anne Wiggins was seen in the ER today with concern for a head injury.  Her imaging of her head, neck, chest, and pelvis, showed no acute abnormalities today. We saw no evidence of broken bones or internal bleeding.   Her urine sample did show some evidence of UTI. I reviewed her chart and she previously has had findings of UTI but her urine cultures don't always grow bacteria requiring treatment. I have sent a urine culture for her, as well as a prescription for antibiotics.

## 2023-03-20 NOTE — Progress Notes (Signed)
Chaplain dropped by pt's room while in ED. Chaplain attempted to connect with pt.  Pt unresponsive with a blank stare toward the ceiling.  There was no family present.  Vernell Morgans Chaplain

## 2023-03-23 LAB — URINE CULTURE: Culture: >=100000 — AB

## 2023-03-24 ENCOUNTER — Telehealth (HOSPITAL_BASED_OUTPATIENT_CLINIC_OR_DEPARTMENT_OTHER): Payer: Self-pay

## 2023-03-24 NOTE — Telephone Encounter (Signed)
Post ED Visit - Positive Culture Follow-up: Successful Patient Follow-Up  Culture assessed and recommendations reviewed by:  []  Enzo Bi, Pharm.D. []  Celedonio Miyamoto, Pharm.D., BCPS AQ-ID []  Garvin Fila, Pharm.D., BCPS []  Georgina Pillion, Pharm.D., BCPS []  [place], [address].D., BCPS, AAHIVP []  Estella Husk, Pharm.D., BCPS, AAHIVP []  Lysle Pearl, PharmD, BCPS []  Phillips Climes, PharmD, BCPS []  Agapito Games, PharmD, BCPS []  Verlan Friends, PharmD  Positive urine culture  []  Patient discharged without antimicrobial prescription and treatment is now indicated [x]  Organism is resistant to prescribed ED discharge antimicrobial []  Patient with positive blood cultures  Changes discussed with ED provider: Cassandria Anger, MD New antibiotic prescription Bactrim DS 1 tab po BID x 3 days Called and faxed to [facility] [place] at [facility] staff of [place] [facility] [place], date 03/24/2023, time 1:50 pm   Sandria Senter 03/24/2023, 1:51 PM

## 2023-03-24 NOTE — Progress Notes (Signed)
ED Antimicrobial Stewardship Positive Culture Follow Up   Anne Wiggins is an 77 y.o. female who presented to [facility] on 03/20/2023 with a chief complaint of  Chief Complaint  Patient presents with   Head Injury    Recent Results (from the past 720 hour(s))  Urine Culture (for pregnant, neutropenic or urologic patients or patients with an indwelling urinary catheter)     Status: Abnormal   Collection Time: 03/20/23 10:22 PM   Specimen: Urine, Clean Catch  Result Value Ref Range Status   Specimen Description URINE, CLEAN CATCH  Final   Special Requests   Final    NONE Performed at [facility], [address], [place], [place] [address]    Culture (A)  Final    >=100,000 COLONIES/mL ESCHERICHIA COLI Confirmed Extended Spectrum Beta-Lactamase Producer (ESBL).  In bloodstream infections from ESBL organisms, carbapenems are preferred over piperacillin/tazobactam. They are shown to have a lower risk of mortality.    Report Status 03/23/2023 FINAL  Final   Organism ID, Bacteria ESCHERICHIA COLI (A)  Final      Susceptibility   Escherichia coli - MIC*    AMPICILLIN >=32 RESISTANT Resistant     CEFAZOLIN >=64 RESISTANT Resistant     CEFEPIME >=32 RESISTANT Resistant     CEFTRIAXONE >=64 RESISTANT Resistant     CIPROFLOXACIN >=4 RESISTANT Resistant     GENTAMICIN <=1 SENSITIVE Sensitive     IMIPENEM <=0.25 SENSITIVE Sensitive     NITROFURANTOIN 128 RESISTANT Resistant     TRIMETH/SULFA <=20 SENSITIVE Sensitive     AMPICILLIN/SULBACTAM >=32 RESISTANT Resistant     PIP/TAZO 8 SENSITIVE Sensitive     * >=100,000 COLONIES/mL ESCHERICHIA COLI    [x]  Treated with Keflex, organism resistant to prescribed antimicrobial  New antibiotic prescription: Stop Keflex, start Bactrim 1 DS PO BID x 3 d.   ED Provider: Derwood Kaplan, MD   Estill Batten, PharmD, BCCCP  03/24/2023, 11:23 AM Clinical Pharmacist Monday - Friday phone -  [phone] Saturday - Sunday phone -  [phone]

## 2023-06-19 IMAGING — CT CT HEAD W/O CM
3 of 4 series · 15 of 47 positions shown, 18 images · non-contrast
Comparison: Head CT 12/02/2020

CLINICAL DATA: Delirium.  Weakness.

EXAM:
CT HEAD WITHOUT CONTRAST
TECHNIQUE: Contiguous axial images were obtained from the base of the skull
through the vertex without intravenous contrast.

[Series 2: head wo · axial · 0.42mm/px · z∈[+1435,+1560]mm · 9 of 31 slices shown, 12 images]
[im 3/31  brain]
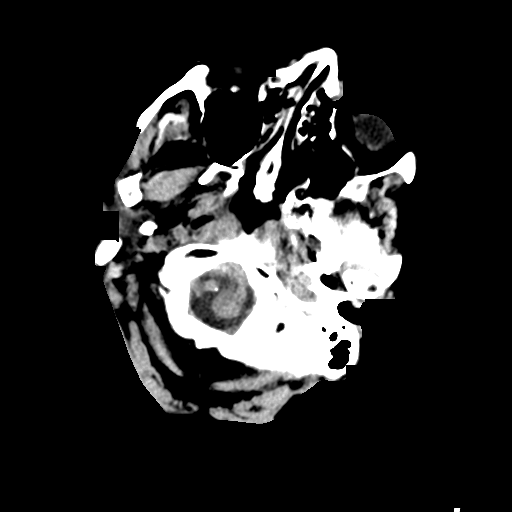
[im 3/31  bone]
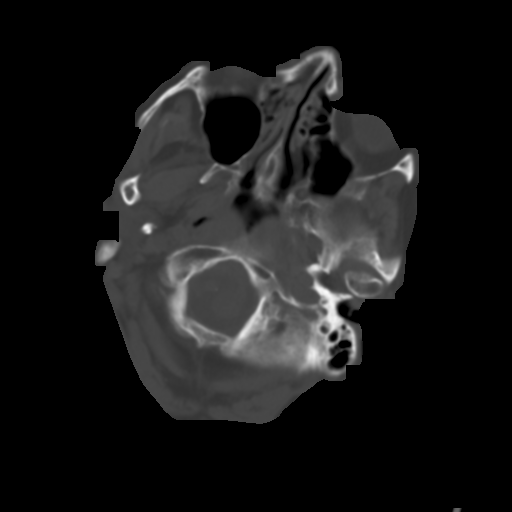
[im 7/31  brain]
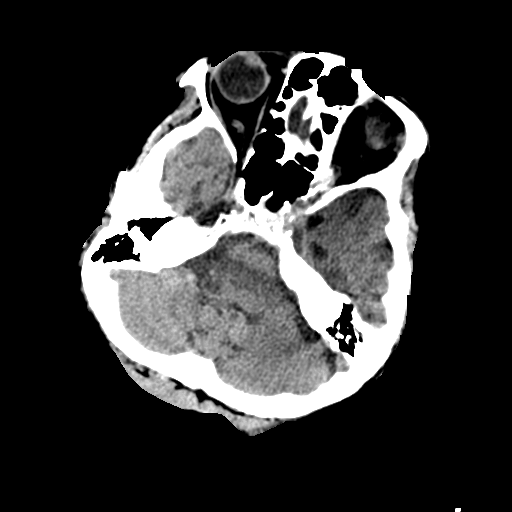
[im 9/31  brain]
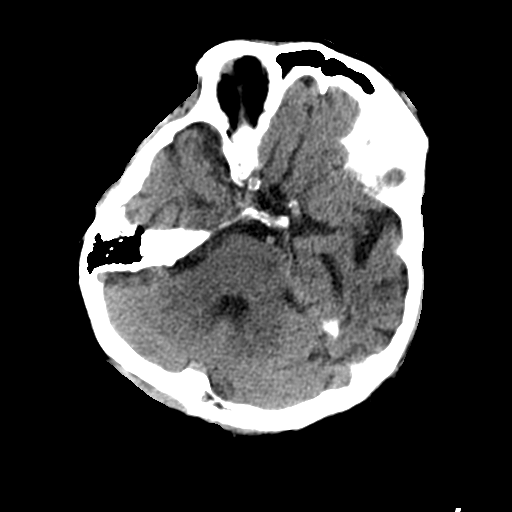
[im 13/31  brain]
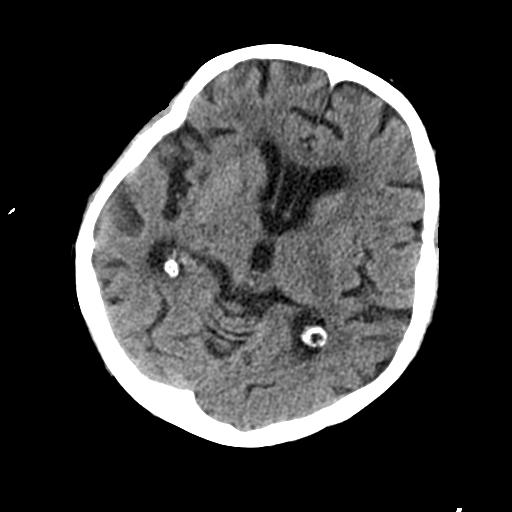
[im 16/31  brain]
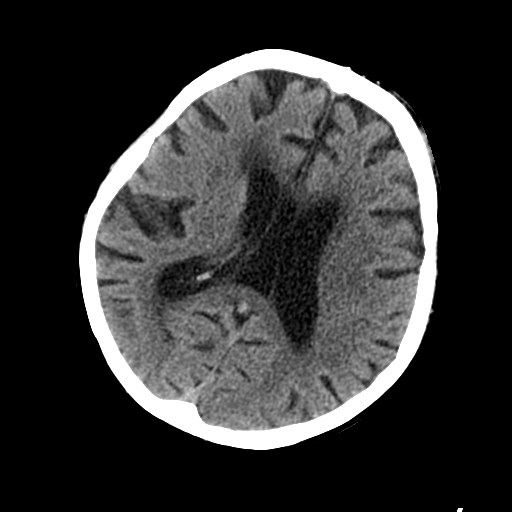
[im 16/31  bone]
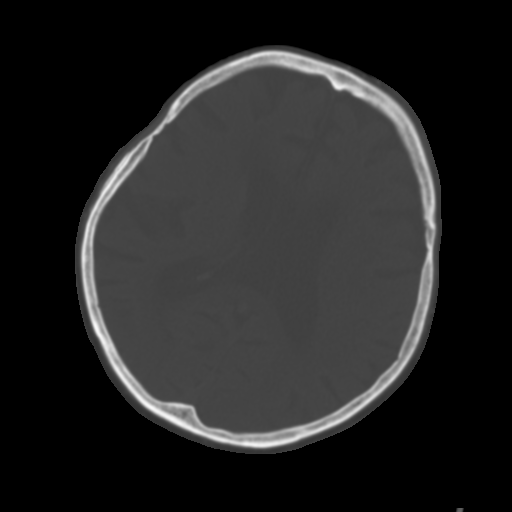
[im 18/31  brain]
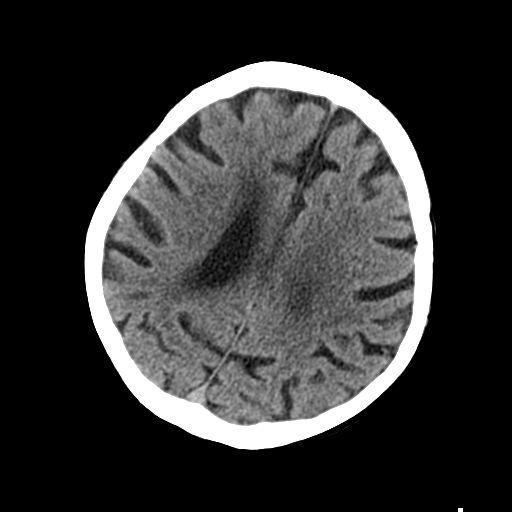
[im 22/31  brain]
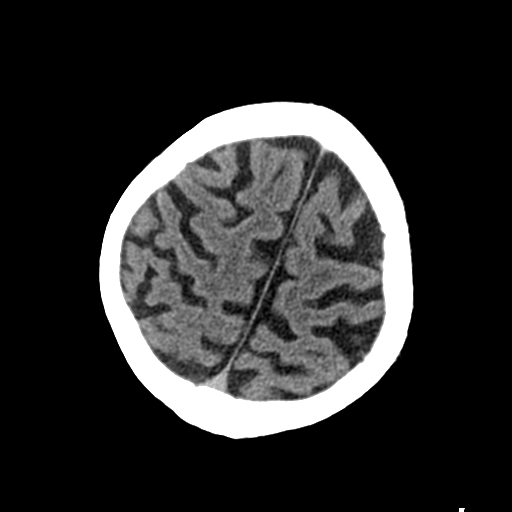
[im 24/31  brain]
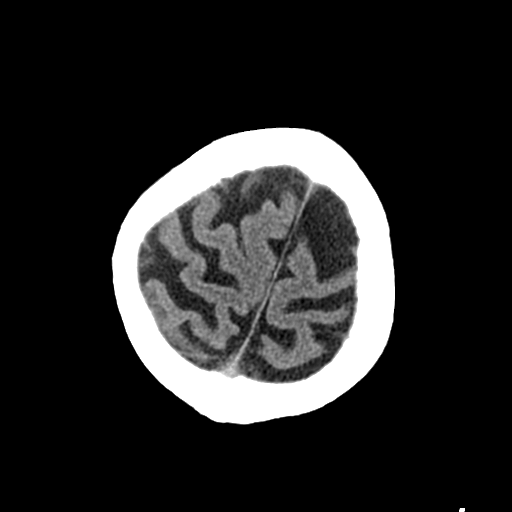
[im 28/31  brain]
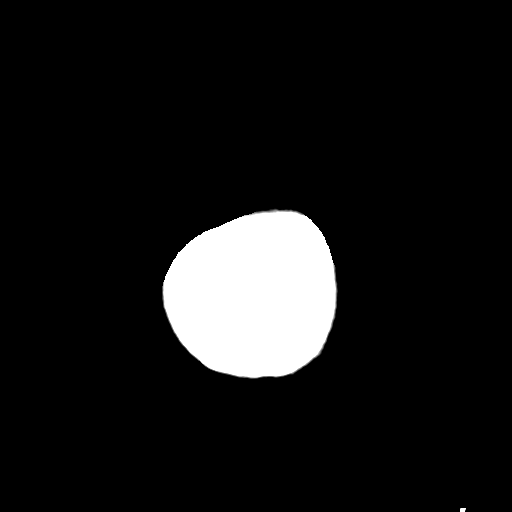
[im 28/31  bone]
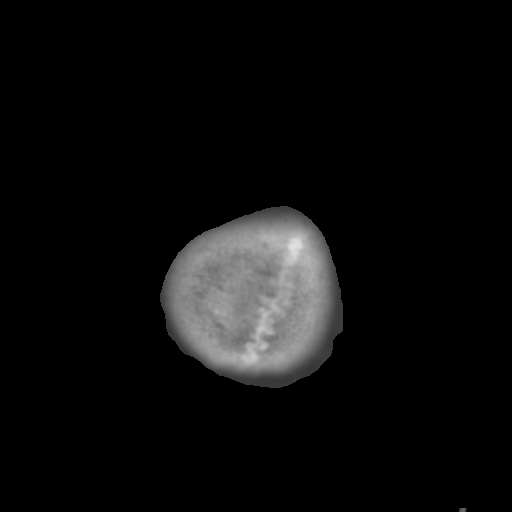

[Series 5: coronal soft tissue · coronal · 0.30mm/px · 3 of 66 slices shown]
[im 22/66  brain]
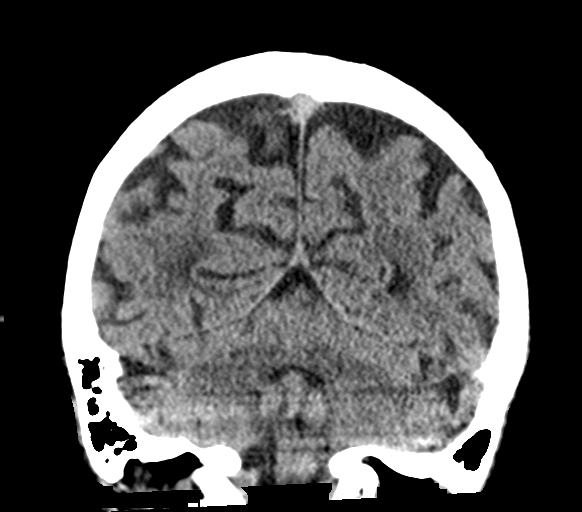
[im 29/66  brain]
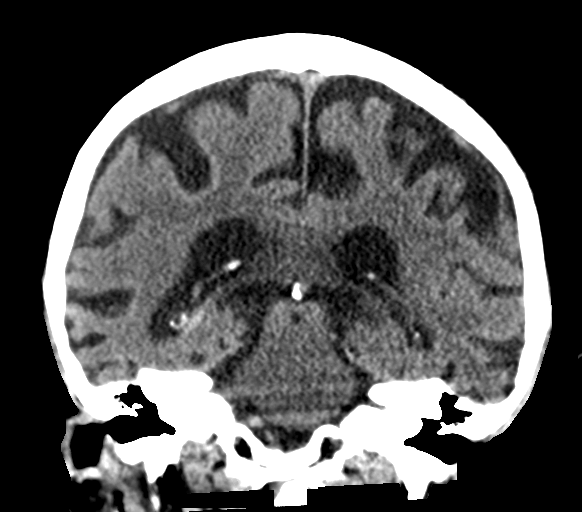
[im 37/66  brain]
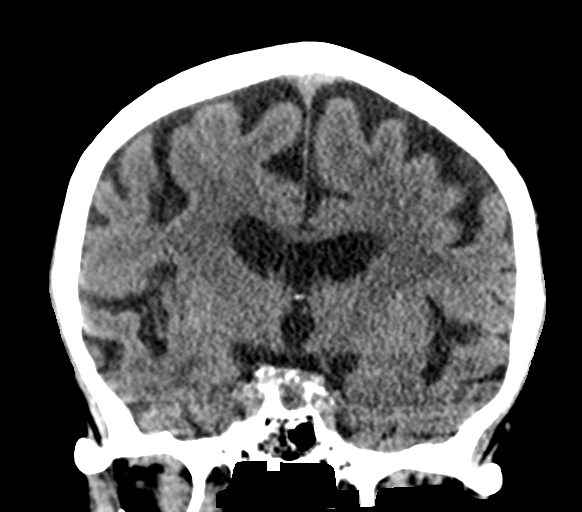

[Series 6: sagittal soft tissue · sagittal · 0.30mm/px · 3 of 59 slices shown]
[im 23/59  brain]
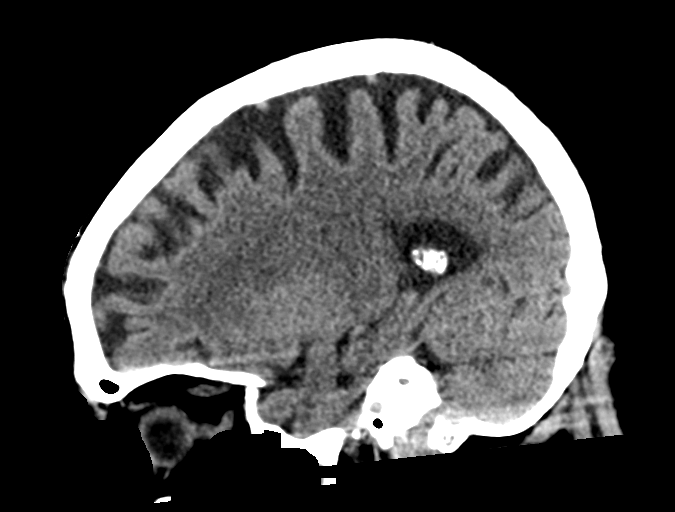
[im 30/59  brain]
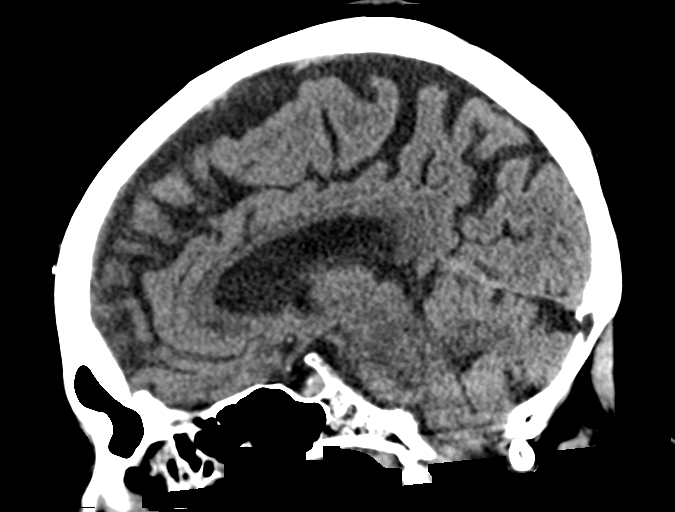
[im 36/59  brain]
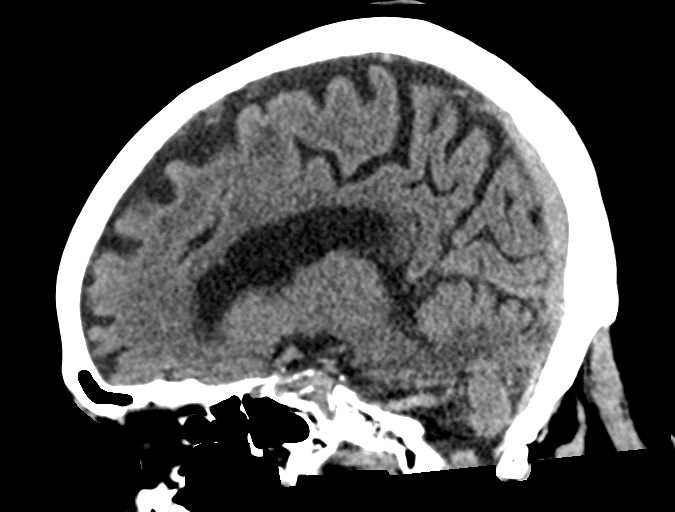

[15 of 47 positions shown; findings below may reference images not displayed]

FINDINGS: Brain: Stable degree of atrophy and chronic small vessel ischemia
from prior. No intracranial hemorrhage, mass effect, or midline
shift. No hydrocephalus. Cavum septum pellucidum, normal variant.
The basilar cisterns are patent. No evidence of territorial infarct
or acute ischemia. No extra-axial or intracranial fluid collection.

Vascular: Atherosclerosis of skullbase vasculature without
hyperdense vessel or abnormal calcification.

Skull: No fracture or focal lesion.

Sinuses/Orbits: No acute findings allowing for mild motion artifact.
Paranasal sinuses and mastoid air cells are grossly clear

Other: None.
IMPRESSION: 1. No acute intracranial abnormality.
2. Stable atrophy and chronic small vessel ischemia.

## 2023-06-20 DEATH — deceased

## 2023-09-01 IMAGING — CT CT ABD-PELV W/ CM
2 of 5 series · 15 of 46 positions shown, 17 images · IV contrast (APPLIED)
Comparison: None.

CLINICAL DATA: Epigastric pain.

EXAM:
CT ABDOMEN AND PELVIS WITH CONTRAST
TECHNIQUE: Multidetector CT imaging of the abdomen and pelvis was performed
using the standard protocol following bolus administration of
intravenous contrast.
CONTRAST:  100mL OMNIPAQUE IOHEXOL 300 MG/ML  SOLN

[Series 3: abdomen 5.0 · axial · 0.71mm/px · z∈[+893,+1258]mm · 12 of 85 slices shown, 14 images]
[im 6/85  soft-tissue]
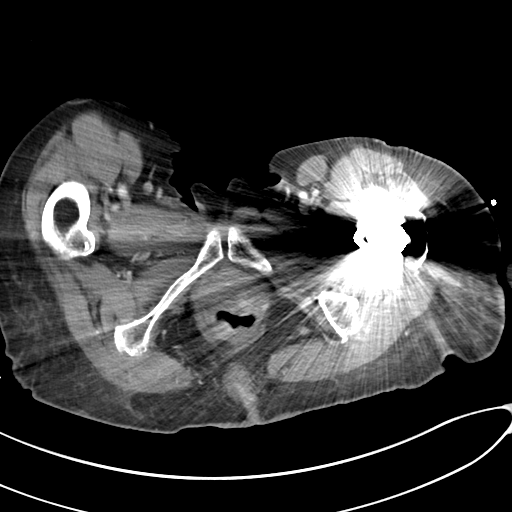
[im 6/85  bone]
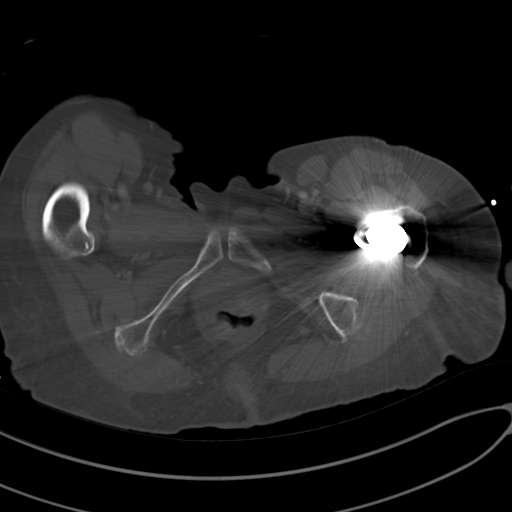
[im 11/85  soft-tissue]
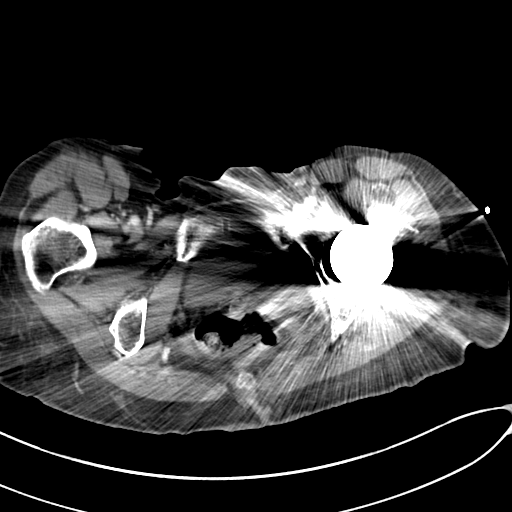
[im 22/85  soft-tissue]
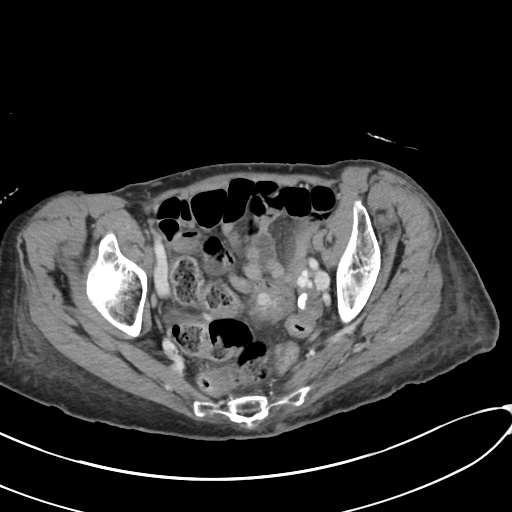
[im 27/85  soft-tissue]
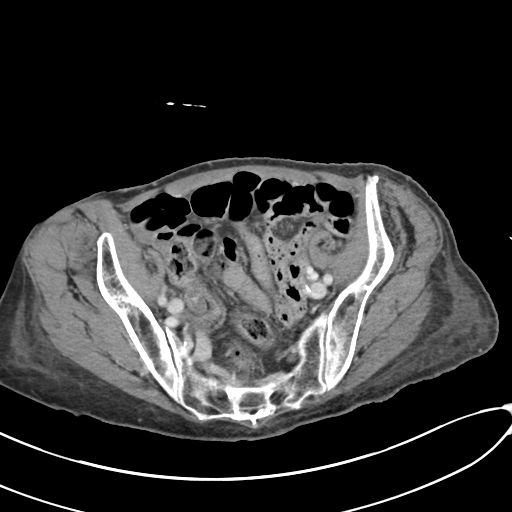
[im 32/85  soft-tissue]
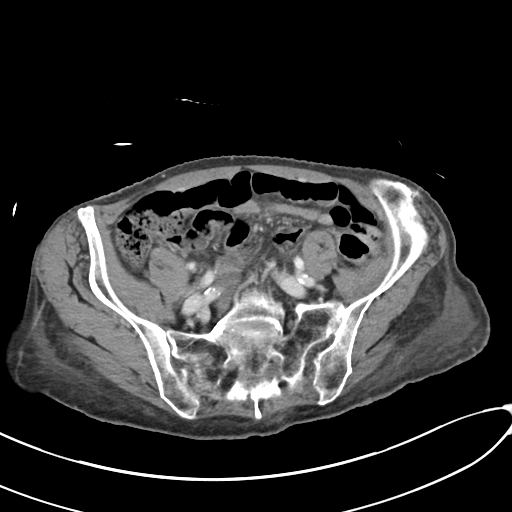
[im 37/85  soft-tissue]
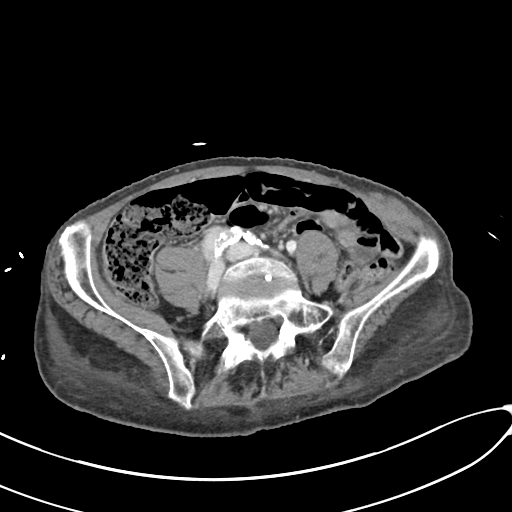
[im 48/85  soft-tissue]
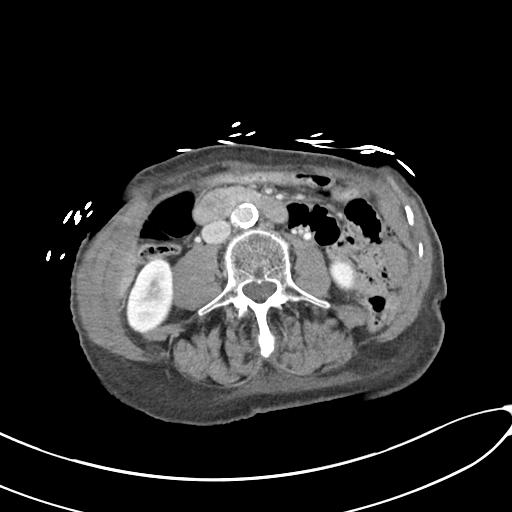
[im 53/85  soft-tissue]
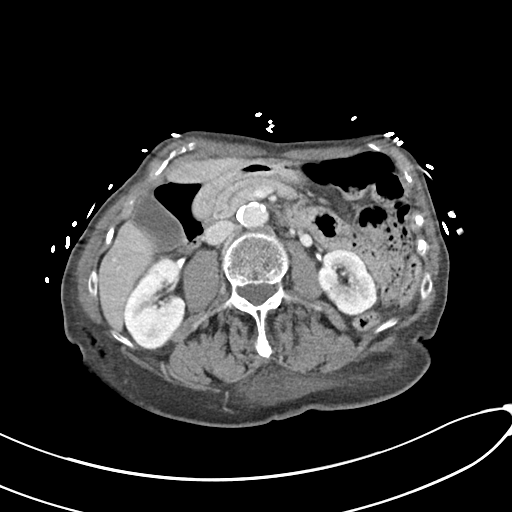
[im 58/85  soft-tissue]
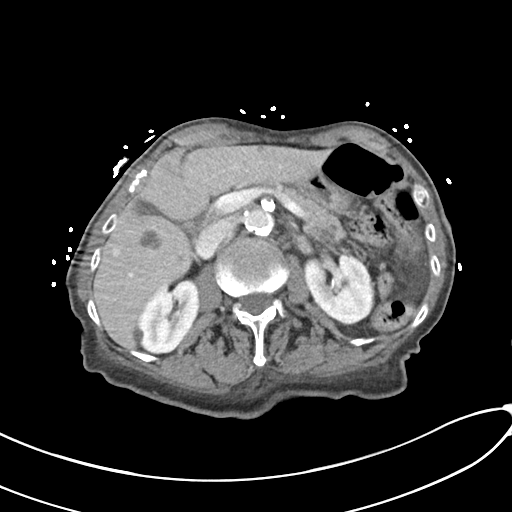
[im 58/85  bone]
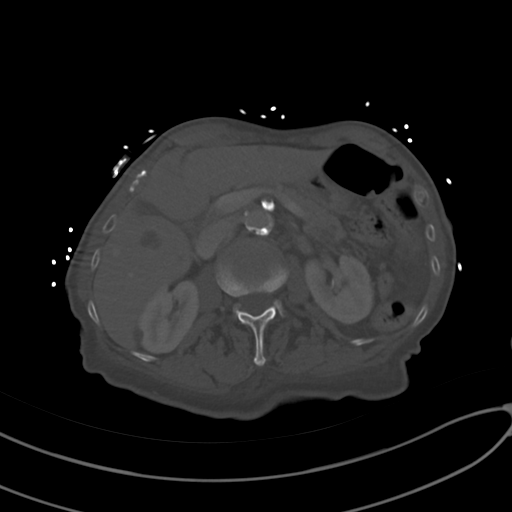
[im 64/85  soft-tissue]
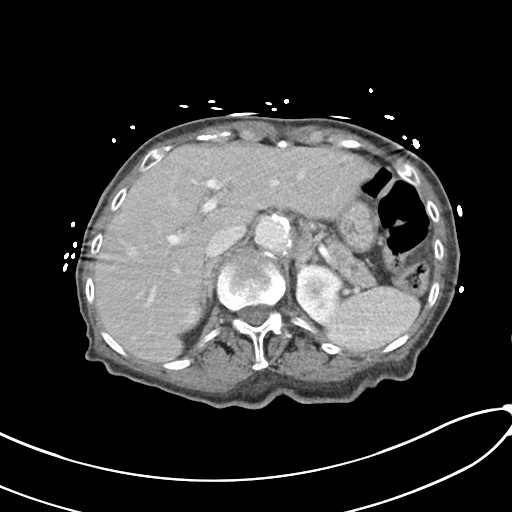
[im 74/85  soft-tissue]
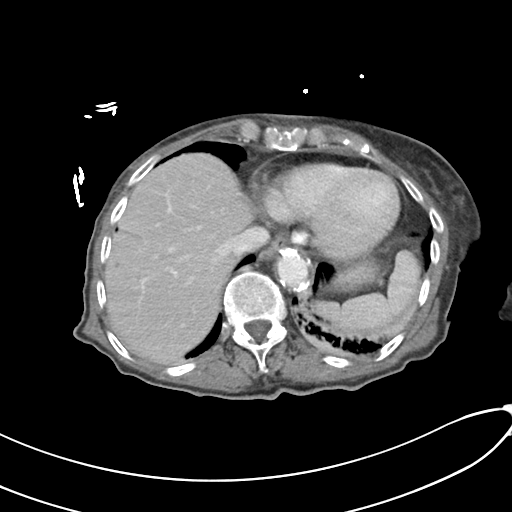
[im 79/85  soft-tissue]
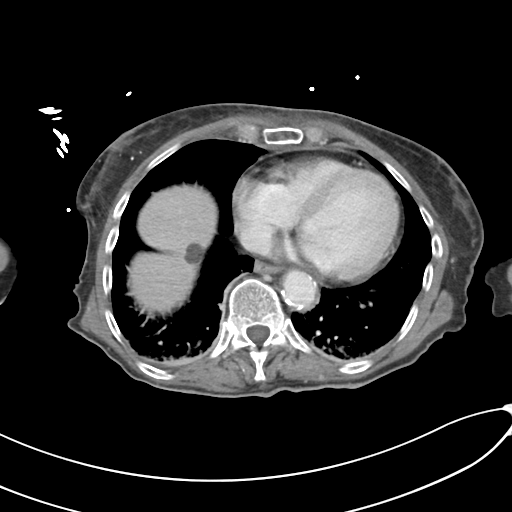

[Series 6: abdomen 3.0 mpr cor · coronal · 0.75mm/px · 3 of 96 slices shown]
[im 32/96  soft-tissue]
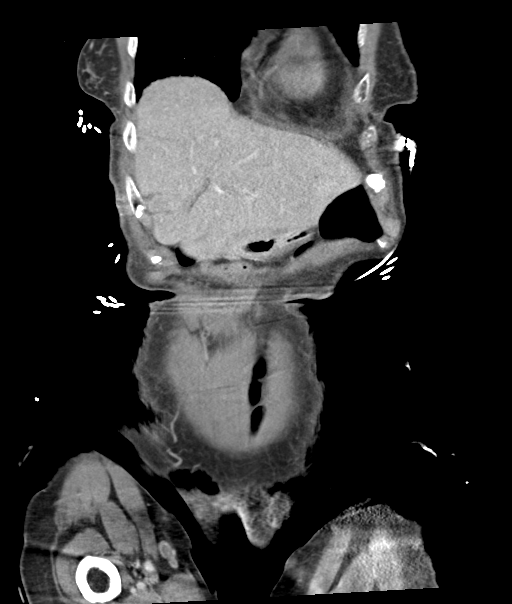
[im 43/96  soft-tissue]
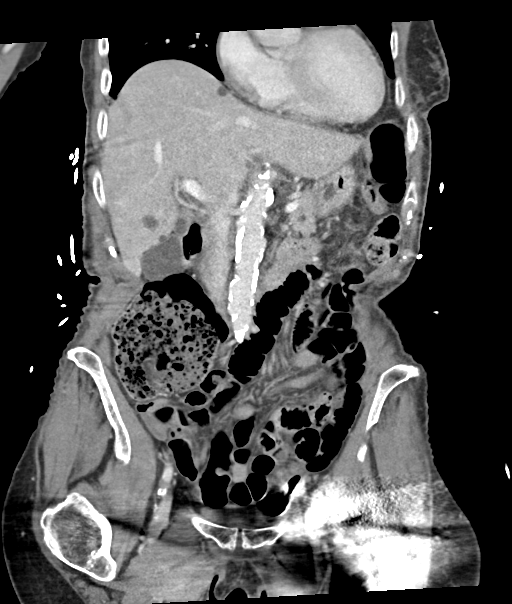
[im 53/96  soft-tissue]
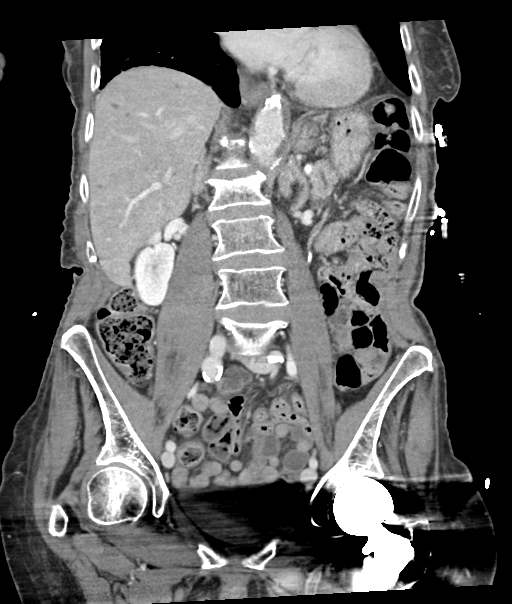

[15 of 46 positions shown; findings below may reference images not displayed]

FINDINGS: Lower chest: Atelectasis and ground-glass attenuation identified
within both lung bases posteriorly.

Hepatobiliary: Liver cysts as well as several too small to reliably
characterize subcentimeter low-density liver lesions noted. No
suspicious liver lesions identified at this time. Gallbladder
appears normal. No intrahepatic bile duct dilatation. Common bile
duct is normal in caliber.

Pancreas: There is mild diffuse increase caliber of the main
pancreatic duct which measures up to 3 mm, image [DATE]. Cystic lesion
within the tail of pancreas measures 2.3 by 1.4 cm, image [DATE].
Previously reported measuring 8 mm on CT from 03/29/2025.

Spleen: Normal in size without focal abnormality.

Adrenals/Urinary Tract: Normal appearance of the right adrenal
gland. Asymmetric enlargement of the left adrenal gland with
maintenance of its adreniform shape. This finding was also reported
on remote CT from 03/30/2015. No kidney mass or hydronephrosis
identified. The urinary bladder appears distended but is partially
obscured due to artifact from patient's left hip arthroplasty
device. No focal bladder abnormality noted within this limitation.

Stomach/Bowel: Stomach appears normal. No signs of bowel wall
thickening, inflammation, or distension. The appendix is not
confidently identified.

Vascular/Lymphatic: Extensive aortic atherosclerosis with branch
vessel involvement. Aneurysmal dilatation of the right common iliac
artery measures 1.9 cm common image 50/3. No signs of abdominopelvic
adenopathy.

Reproductive: Uterus and bilateral adnexa are unremarkable.

Other: No ascites or focal fluid collections identified.

Musculoskeletal: Previous left hip arthroplasty. Age-indeterminate
compression fractures are identified at L1, L2 and L5. Here there is
loss of none approximately 40% of the vertebral body heights.
IMPRESSION: 1. No acute findings identified within the abdomen or pelvis.
2. Cystic lesion within the tail of pancreas is identified. By
report, this has increased in size from 03/29/2025. This does not
require emergent attention. When the patient is clinically stable
and able to follow directions and hold their breath (preferably as
an outpatient) further evaluation with dedicated contrast enhanced
abdominal MRI should be considered.
3. Age-indeterminate compression fractures at L1, L2 and L5.
4. Aortic Atherosclerosis ([id number]).
